# Patient Record
Sex: Female | Born: 1950
Health system: Southern US, Community
[De-identification: ages and names within clinical notes are randomized; demographics above are authoritative.]

## PROBLEM LIST (undated history)

## (undated) DIAGNOSIS — G43909 Migraine, unspecified, not intractable, without status migrainosus: Secondary | ICD-10-CM

## (undated) DIAGNOSIS — G4700 Insomnia, unspecified: Secondary | ICD-10-CM

## (undated) DIAGNOSIS — B009 Herpesviral infection, unspecified: Secondary | ICD-10-CM

## (undated) DIAGNOSIS — K219 Gastro-esophageal reflux disease without esophagitis: Secondary | ICD-10-CM

## (undated) DIAGNOSIS — E785 Hyperlipidemia, unspecified: Secondary | ICD-10-CM

## (undated) DIAGNOSIS — F32A Depression, unspecified: Secondary | ICD-10-CM

## (undated) HISTORY — DX: Insomnia, unspecified: G47.00

## (undated) HISTORY — PX: UPPER GI ENDOSCOPY: SHX6162

## (undated) HISTORY — PX: EYE SURGERY: SHX253

## (undated) HISTORY — PX: SHOULDER SURGERY: SHX246

## (undated) HISTORY — PX: COLONOSCOPY: SHX174

## (undated) HISTORY — PX: BREAST SURGERY: SHX581

## (undated) HISTORY — DX: Migraine, unspecified, not intractable, without status migrainosus: G43.909

## (undated) HISTORY — PX: VAGINAL HYSTERECTOMY: SUR661

## (undated) HISTORY — DX: Depression, unspecified: F32.A

## (undated) HISTORY — DX: Gastro-esophageal reflux disease without esophagitis: K21.9

## (undated) HISTORY — PX: WISDOM TOOTH EXTRACTION: SHX21

---

## 1999-10-29 ENCOUNTER — Other Ambulatory Visit: Admission: RE | Admit: 1999-10-29 | Discharge: 1999-10-29 | Payer: Self-pay | Admitting: *Deleted

## 2000-10-31 ENCOUNTER — Other Ambulatory Visit: Admission: RE | Admit: 2000-10-31 | Discharge: 2000-10-31 | Payer: Self-pay | Admitting: *Deleted

## 2001-10-26 ENCOUNTER — Other Ambulatory Visit: Admission: RE | Admit: 2001-10-26 | Discharge: 2001-10-26 | Payer: Self-pay | Admitting: Obstetrics and Gynecology

## 2002-04-07 ENCOUNTER — Ambulatory Visit (HOSPITAL_COMMUNITY): Admission: RE | Admit: 2002-04-07 | Discharge: 2002-04-07 | Payer: Self-pay | Admitting: Gastroenterology

## 2002-10-26 ENCOUNTER — Other Ambulatory Visit: Admission: RE | Admit: 2002-10-26 | Discharge: 2002-10-26 | Payer: Self-pay | Admitting: Obstetrics and Gynecology

## 2010-08-23 ENCOUNTER — Ambulatory Visit (HOSPITAL_COMMUNITY)
Admission: RE | Admit: 2010-08-23 | Discharge: 2010-08-23 | Payer: Self-pay | Source: Home / Self Care | Attending: Chiropractic Medicine | Admitting: Chiropractic Medicine

## 2010-08-31 ENCOUNTER — Ambulatory Visit (HOSPITAL_COMMUNITY)
Admission: RE | Admit: 2010-08-31 | Discharge: 2010-08-31 | Payer: Self-pay | Source: Home / Self Care | Attending: Chiropractic Medicine | Admitting: Chiropractic Medicine

## 2010-12-17 ENCOUNTER — Encounter (HOSPITAL_BASED_OUTPATIENT_CLINIC_OR_DEPARTMENT_OTHER)
Admission: RE | Admit: 2010-12-17 | Discharge: 2010-12-17 | Disposition: A | Discharge status: Home / Self Care | Payer: BC Managed Care – PPO | Source: Ambulatory Visit | Attending: Orthopaedic Surgery | Admitting: Orthopaedic Surgery

## 2010-12-18 ENCOUNTER — Ambulatory Visit (HOSPITAL_BASED_OUTPATIENT_CLINIC_OR_DEPARTMENT_OTHER)
Admission: RE | Admit: 2010-12-18 | Discharge: 2010-12-18 | Disposition: A | Discharge status: Home / Self Care | Payer: BC Managed Care – PPO | Source: Ambulatory Visit | Attending: Orthopaedic Surgery | Admitting: Orthopaedic Surgery

## 2010-12-18 DIAGNOSIS — Z01812 Encounter for preprocedural laboratory examination: Secondary | ICD-10-CM | POA: Insufficient documentation

## 2010-12-18 DIAGNOSIS — K219 Gastro-esophageal reflux disease without esophagitis: Secondary | ICD-10-CM | POA: Insufficient documentation

## 2010-12-18 DIAGNOSIS — M25819 Other specified joint disorders, unspecified shoulder: Secondary | ICD-10-CM | POA: Insufficient documentation

## 2010-12-18 DIAGNOSIS — M75120 Complete rotator cuff tear or rupture of unspecified shoulder, not specified as traumatic: Secondary | ICD-10-CM | POA: Insufficient documentation

## 2011-01-08 NOTE — Op Note (Signed)
NAMEDUNIA, PRINGLE NO.:  [id number]  MEDICAL RECORD NO.:  [id number]           PATIENT TYPE:  LOCATION:                                 FACILITY:  PHYSICIAN:  Lubertha Basque. Michele Suarez, M.D.DATE OF BIRTH:  1950/09/19  DATE OF PROCEDURE:  12/18/2010 DATE OF DISCHARGE:                              OPERATIVE REPORT   PREOPERATIVE DIAGNOSES: 1. Right shoulder impingement. 2. Right shoulder partial rotator cuff tear.  POSTOPERATIVE DIAGNOSES: 1. Right shoulder impingement. 2. Right shoulder full-thickness rotator cuff tear.  PROCEDURES: 1. Right shoulder arthroscopic acromioplasty. 2. Right shoulder arthroscopic partial claviculectomy. 3. Right shoulder arthroscopic rotator cuff repair.  ANESTHESIA:  General and block.  ATTENDING SURGEON:  Lubertha Basque. Jerl Santos, MD  ASSISTANT:  Ferdinand Lango, RN   INDICATIONS FOR PROCEDURE:  The patient is a 60 year old woman with many months of intense right shoulder pain.  This has persisted despite injection which helped for a few hours.  She has also tried therapy program and oral anti-inflammatories.  On MRI scan, she has a fairly significant partial-thickness cuff tear.  She has pain which limits her ability to use her arm and rest and she is offered an arthroscopy. Informed operative consent was obtained after discussion of possible complications including reaction to anesthesia and infection.  SUMMARY, FINDINGS, AND PROCEDURE:  Under general anesthesia and a block, a right shoulder arthroscopy was performed.  The glenohumeral joint showed no degenerative changes and the biceps tendon appeared benign. She did have some fairly deep tearing of the rotator cuff seen from below.  In the subacromial space, this was confirmed to be full- thickness at the supraspinatus attachment and about a centimeter of the tissue was detached completely.  The tissue seemed to be very good.  We performed an acromioplasty and a partial  claviculectomy and then repaired the rotator cuff with 2 reverse mattress sutures of FiberWire, stabilized with PushLock anchors.  DESCRIPTION OF PROCEDURE:  The patient was taken to the operating suite where general anesthetic was applied without difficulty.  She was also given a block in the preanesthesia area.  She was positioned in a beach- chair position and prepped and draped in a normal sterile fashion. After administration of IV Kefzol, an arthroscopy of the right shoulder was performed through a total of 4 portals.  Findings were as noted above and procedure consisted of the arthroscopic acromioplasty done with a bur in the lateral position following transfer of the bur to the posterior position.  We then performed a partial claviculectomy in a similar fashion.  I then created a bleeding bed of bone under the area of her rotator cuff tear.  We passed two #2 FiberWire in a reverse mattress suture fashion using a scorpion device through the rotator cuff.  We then utilized the sutures to reapproximate the cuff to the bleeding bed of bone using 2 PushLock anchors by Arthrex.  This seemed to give [place] a nice tight repair.  The shoulder was thoroughly irrigated followed by reapproximation of portals loosely with nylon.  Adaptic was applied followed by dry gauze and tape.  Estimated blood loss and intraoperative  can be fluids obtained from anesthesia records.  DISPOSITION:  The patient was extubated in the operating room and taken to the recovery room in stable addition.  She was to go home on the same day and follow up in my office closely.  I will contact her by phone tonight.     Lubertha Basque Jerl Santos, M.D.     PGD/MEDQ  D:  12/18/2010  T:  12/19/2010  Job:  [id number]  Electronically Signed by Michele Suarez M.D. on 01/08/2011 02:19:07 PM

## 2016-07-15 ENCOUNTER — Encounter (HOSPITAL_COMMUNITY): Payer: Self-pay

## 2016-07-15 ENCOUNTER — Other Ambulatory Visit: Payer: Self-pay | Admitting: Obstetrics and Gynecology

## 2016-07-15 ENCOUNTER — Encounter (HOSPITAL_COMMUNITY)
Admission: RE | Admit: 2016-07-15 | Discharge: 2016-07-15 | Disposition: A | Discharge status: Home / Self Care | Payer: BLUE CROSS/BLUE SHIELD | Source: Ambulatory Visit | Attending: Obstetrics and Gynecology | Admitting: Obstetrics and Gynecology

## 2016-07-15 ENCOUNTER — Other Ambulatory Visit: Payer: Self-pay

## 2016-07-15 DIAGNOSIS — E785 Hyperlipidemia, unspecified: Secondary | ICD-10-CM | POA: Diagnosis not present

## 2016-07-15 DIAGNOSIS — Z01818 Encounter for other preprocedural examination: Secondary | ICD-10-CM | POA: Diagnosis not present

## 2016-07-15 DIAGNOSIS — N901 Moderate vulvar dysplasia: Secondary | ICD-10-CM | POA: Diagnosis present

## 2016-07-15 DIAGNOSIS — N959 Unspecified menopausal and perimenopausal disorder: Secondary | ICD-10-CM | POA: Diagnosis not present

## 2016-07-15 HISTORY — DX: Hyperlipidemia, unspecified: E78.5

## 2016-07-15 HISTORY — DX: Herpesviral infection, unspecified: B00.9

## 2016-07-15 NOTE — Patient Instructions (Addendum)
Your procedure is scheduled on: Wednesday, 11/8  Enter through the Main Entrance of [facility] at: 10 AM  Pick up the phone at the desk and dial [phone].  Call this number if you have problems the morning of surgery: [phone].  Remember: Do NOT eat or drink (including water) after midnight Tuesday 11/7  Take these medicines the morning of surgery with a SIP OF WATER:  None  Do NOT wear jewelry (body piercing), metal hair clips/bobby pins, make-up, or nail polish.  Do NOT wear lotions, powders, or perfumes.  You may wear deoderant.  Do NOT shave for 48 hours prior to surgery.  Do NOT bring valuables to the hospital.   Have a responsible adult drive you home and stay with you for 24 hours after your procedure.  Home with friend Kyra Manges cell [phone]

## 2016-07-16 LAB — CBC
HCT: 41.9 % (ref 36.0–46.0)
Hemoglobin: 14.5 g/dL (ref 12.0–15.0)
MCH: 32.5 pg (ref 26.0–34.0)
MCHC: 34.6 g/dL (ref 30.0–36.0)
MCV: 93.9 fL (ref 78.0–100.0)
PLATELETS: 241 10*3/uL (ref 150–400)
RBC: 4.46 MIL/uL (ref 3.87–5.11)
RDW: 13.6 % (ref 11.5–15.5)
WBC: 7.5 10*3/uL (ref 4.0–10.5)

## 2016-07-16 MED ORDER — CEFAZOLIN SODIUM-DEXTROSE 2-4 GM/100ML-% IV SOLN
2.0000 g | INTRAVENOUS | Status: AC
  Administered 2016-07-17: 2 g via INTRAVENOUS

## 2016-07-16 NOTE — H&P (Signed)
Michele Suarez is an 65 y.o. female for wide local excision for VIN 2.  Pertinent Gynecological History: Menses: post-menopausal Bleeding: na Contraception: none DES exposure: denies Blood transfusions: none Sexually transmitted diseases: no past history Previous GYN Procedures: DNC  Last mammogram: normal Date: 2017 Last pap: normal Date: 2017 OB History: G2, P2   Menstrual History: Menarche age: 64 No LMP recorded. Patient is postmenopausal.    Past Medical History:  Diagnosis Date  . HSV infection   . Hyperlipidemia   . SVD (spontaneous vaginal delivery)    x 3    Past Surgical History:  Procedure Laterality Date  . BREAST SURGERY     reduction  . COLONOSCOPY    . EYE SURGERY Bilateral    cataract  . SHOULDER SURGERY Bilateral    x 2  . UPPER GI ENDOSCOPY    . VAGINAL HYSTERECTOMY    . WISDOM TOOTH EXTRACTION      No family history on file.  Social History:  reports that she has never smoked. She has never used smokeless tobacco. She reports that she drinks about 1.8 - 2.4 oz of alcohol per week . She reports that she does not use drugs.  Allergies: No Known Allergies  No prescriptions prior to admission.    Review of Systems  Constitutional: Negative.   All other systems reviewed and are negative.   There were no vitals taken for this visit. Physical Exam  Nursing note and vitals reviewed. Constitutional: She is oriented to person, place, and time. She appears well-developed and well-nourished.  Eyes: Pupils are equal, round, and reactive to light.  Neck: Normal range of motion. Neck supple.  Cardiovascular: Normal rate and regular rhythm.   Respiratory: Effort normal and breath sounds normal.  GI: Soft. Bowel sounds are normal.  Genitourinary: Vagina normal and uterus normal.  Musculoskeletal: Normal range of motion.  Neurological: She is alert and oriented to person, place, and time. She has normal reflexes.  Skin: Skin is warm and dry.   Psychiatric: She has a normal mood and affect.    No results found for this or any previous visit (from the past 24 hour(s)).  No results found.  Assessment/Plan: VIN 2 by bx Wide local excision Consent done.  Irish Piech J 07/16/2016, 8:10 PM

## 2016-07-17 ENCOUNTER — Ambulatory Visit (HOSPITAL_COMMUNITY): Payer: BLUE CROSS/BLUE SHIELD | Admitting: Anesthesiology

## 2016-07-17 ENCOUNTER — Encounter (HOSPITAL_COMMUNITY): Payer: Self-pay | Admitting: Anesthesiology

## 2016-07-17 ENCOUNTER — Encounter (HOSPITAL_COMMUNITY)
Admission: RE | Disposition: A | Discharge status: Home / Self Care | Payer: Self-pay | Source: Ambulatory Visit | Attending: Obstetrics and Gynecology

## 2016-07-17 ENCOUNTER — Ambulatory Visit (HOSPITAL_COMMUNITY)
Admission: RE | Admit: 2016-07-17 | Discharge: 2016-07-17 | Disposition: A | Discharge status: Home / Self Care | Payer: BLUE CROSS/BLUE SHIELD | Source: Ambulatory Visit | Attending: Obstetrics and Gynecology | Admitting: Obstetrics and Gynecology

## 2016-07-17 DIAGNOSIS — Z01818 Encounter for other preprocedural examination: Secondary | ICD-10-CM | POA: Insufficient documentation

## 2016-07-17 DIAGNOSIS — N901 Moderate vulvar dysplasia: Secondary | ICD-10-CM | POA: Insufficient documentation

## 2016-07-17 DIAGNOSIS — E785 Hyperlipidemia, unspecified: Secondary | ICD-10-CM | POA: Insufficient documentation

## 2016-07-17 DIAGNOSIS — N959 Unspecified menopausal and perimenopausal disorder: Secondary | ICD-10-CM | POA: Insufficient documentation

## 2016-07-17 HISTORY — PX: VULVECTOMY: SHX1086

## 2016-07-17 SURGERY — WIDE EXCISION VULVECTOMY
Anesthesia: General

## 2016-07-17 MED ORDER — FENTANYL CITRATE (PF) 100 MCG/2ML IJ SOLN
INTRAMUSCULAR | Status: AC
  Filled 2016-07-17: qty 4

## 2016-07-17 MED ORDER — PROPOFOL 10 MG/ML IV BOLUS
INTRAVENOUS | Status: DC | PRN
  Administered 2016-07-17: 150 mg via INTRAVENOUS

## 2016-07-17 MED ORDER — HYDROMORPHONE HCL 1 MG/ML IJ SOLN: 0.2500 mg | INTRAMUSCULAR | Status: DC | PRN

## 2016-07-17 MED ORDER — MIDAZOLAM HCL 2 MG/2ML IJ SOLN
INTRAMUSCULAR | Status: DC | PRN
  Administered 2016-07-17: 2 mg via INTRAVENOUS

## 2016-07-17 MED ORDER — FENTANYL CITRATE (PF) 100 MCG/2ML IJ SOLN
INTRAMUSCULAR | Status: DC | PRN
  Administered 2016-07-17 (×2): 50 ug via INTRAVENOUS

## 2016-07-17 MED ORDER — BUPIVACAINE HCL (PF) 0.25 % IJ SOLN
INTRAMUSCULAR | Status: DC | PRN
  Administered 2016-07-17: 10 mL

## 2016-07-17 MED ORDER — OXYCODONE-ACETAMINOPHEN 5-325 MG PO TABS: 1.0000 | ORAL_TABLET | ORAL | 0 refills | Status: DC | PRN

## 2016-07-17 MED ORDER — DEXAMETHASONE SODIUM PHOSPHATE 4 MG/ML IJ SOLN
INTRAMUSCULAR | Status: DC | PRN
  Administered 2016-07-17: 4 mg via INTRAVENOUS

## 2016-07-17 MED ORDER — LACTATED RINGERS IV SOLN
INTRAVENOUS | Status: DC
  Administered 2016-07-17 (×3): via INTRAVENOUS

## 2016-07-17 MED ORDER — PHENYLEPHRINE HCL 10 MG/ML IJ SOLN
INTRAMUSCULAR | Status: DC | PRN
Start: 2016-07-17 — End: 2016-07-17
  Administered 2016-07-17 (×3): 40 ug via INTRAVENOUS
  Administered 2016-07-17: 80 ug via INTRAVENOUS

## 2016-07-17 MED ORDER — KETOROLAC TROMETHAMINE 30 MG/ML IJ SOLN
INTRAMUSCULAR | Status: AC
  Filled 2016-07-17: qty 1

## 2016-07-17 MED ORDER — MIDAZOLAM HCL 2 MG/2ML IJ SOLN
INTRAMUSCULAR | Status: AC
  Filled 2016-07-17: qty 2

## 2016-07-17 MED ORDER — PROPOFOL 10 MG/ML IV BOLUS
INTRAVENOUS | Status: AC
  Filled 2016-07-17: qty 20

## 2016-07-17 MED ORDER — LIDOCAINE HCL (CARDIAC) 20 MG/ML IV SOLN
INTRAVENOUS | Status: AC
  Filled 2016-07-17: qty 5

## 2016-07-17 MED ORDER — MEPERIDINE HCL 25 MG/ML IJ SOLN: 6.2500 mg | INTRAMUSCULAR | Status: DC | PRN

## 2016-07-17 MED ORDER — ONDANSETRON HCL 4 MG/2ML IJ SOLN
INTRAMUSCULAR | Status: AC
  Filled 2016-07-17: qty 2

## 2016-07-17 MED ORDER — ONDANSETRON HCL 4 MG/2ML IJ SOLN: 4.0000 mg | Freq: Once | INTRAMUSCULAR | Status: DC | PRN

## 2016-07-17 MED ORDER — DEXAMETHASONE SODIUM PHOSPHATE 4 MG/ML IJ SOLN
INTRAMUSCULAR | Status: AC
  Filled 2016-07-17: qty 1

## 2016-07-17 MED ORDER — BUPIVACAINE HCL (PF) 0.25 % IJ SOLN
INTRAMUSCULAR | Status: AC
  Filled 2016-07-17: qty 30

## 2016-07-17 MED ORDER — ONDANSETRON HCL 4 MG/2ML IJ SOLN
INTRAMUSCULAR | Status: DC | PRN
  Administered 2016-07-17: 4 mg via INTRAVENOUS

## 2016-07-17 MED ORDER — LIDOCAINE HCL (CARDIAC) 20 MG/ML IV SOLN
INTRAVENOUS | Status: DC | PRN
  Administered 2016-07-17: 60 mg via INTRAVENOUS

## 2016-07-17 MED ORDER — KETOROLAC TROMETHAMINE 30 MG/ML IJ SOLN
INTRAMUSCULAR | Status: DC | PRN
  Administered 2016-07-17: 30 mg via INTRAVENOUS

## 2016-07-17 SURGICAL SUPPLY — 21 items
BLADE SURG 15 STRL LF C SS BP (BLADE) ×1 IMPLANT
BLADE SURG 15 STRL SS (BLADE) ×3
CATH ROBINSON RED A/P 16FR (CATHETERS) ×3 IMPLANT
CLOTH BEACON ORANGE TIMEOUT ST (SAFETY) ×3 IMPLANT
COUNTER NEEDLE 1200 MAGNETIC (NEEDLE) ×2 IMPLANT
ELECT NDL TIP 2.8 STRL (NEEDLE) IMPLANT
ELECT NEEDLE TIP 2.8 STRL (NEEDLE) ×3 IMPLANT
ELECT REM PT RETURN 9FT ADLT (ELECTROSURGICAL) ×3
ELECTRODE REM PT RTRN 9FT ADLT (ELECTROSURGICAL) IMPLANT
GLOVE BIO SURGEON STRL SZ7.5 (GLOVE) ×3 IMPLANT
GLOVE BIOGEL PI IND STRL 7.0 (GLOVE) ×1 IMPLANT
GLOVE BIOGEL PI INDICATOR 7.0 (GLOVE) ×2
GOWN STRL REUS W/TWL LRG LVL3 (GOWN DISPOSABLE) ×9 IMPLANT
NEEDLE HYPO 22GX1.5 SAFETY (NEEDLE) ×3 IMPLANT
PACK VAGINAL MINOR WOMEN LF (CUSTOM PROCEDURE TRAY) ×3 IMPLANT
PAD OB MATERNITY 4.3X12.25 (PERSONAL CARE ITEMS) ×3 IMPLANT
PENCIL BUTTON HOLSTER BLD 10FT (ELECTRODE) ×2 IMPLANT
SUT VICRYL RAPIDE 3-0 36IN (SUTURE) ×6 IMPLANT
SUT VICRYL RAPIDE 4/0 PS 2 (SUTURE) IMPLANT
TOWEL OR 17X24 6PK STRL BLUE (TOWEL DISPOSABLE) ×6 IMPLANT
WATER STERILE IRR 1000ML POUR (IV SOLUTION) ×3 IMPLANT

## 2016-07-17 NOTE — Anesthesia Procedure Notes (Signed)
Procedure Name: LMA Insertion Date/Time: 07/17/2016 11:23 AM Performed by: Brock Ra Pre-anesthesia Checklist: Patient identified, Patient being monitored, Emergency Drugs available, Timeout performed and Suction available Patient Re-evaluated:Patient Re-evaluated prior to inductionOxygen Delivery Method: Circle System Utilized Preoxygenation: Pre-oxygenation with 100% oxygen Intubation Type: IV induction Ventilation: Mask ventilation without difficulty LMA: LMA inserted LMA Size: 4.0 Number of attempts: 1 Placement Confirmation: positive ETCO2 and breath sounds checked- equal and bilateral Dental Injury: Teeth and Oropharynx as per pre-operative assessment

## 2016-07-17 NOTE — Discharge Instructions (Signed)

## 2016-07-17 NOTE — Op Note (Signed)
07/17/2016  12:04 PM  PATIENT:  Michele Suarez  65 y.o. female  PRE-OPERATIVE DIAGNOSIS:  VIN 2  POST-OPERATIVE DIAGNOSIS:  VIN 2  PROCEDURE:  Procedure(s): WIDE Local Vulvar EXCISION  SURGEON:  Surgeon(s): Brien Few, MD  ASSISTANTS: none   ANESTHESIA:   local and general  ESTIMATED BLOOD LOSS:minimal  DRAINS: none   LOCAL MEDICATIONS USED:  MARCAINE    and Amount: 10 ml  SPECIMEN:  Source of Specimen:  posterior fourchette  DISPOSITION OF SPECIMEN:  PATHOLOGY  COUNTS:  YES  DICTATION #: no dictation number given  PLAN OF CARE: dc home  PATIENT DISPOSITION:  PACU - hemodynamically stable.

## 2016-07-17 NOTE — Progress Notes (Signed)
Patient seen and examined. Consent witnessed and signed. No changes noted. Update completed.Patient ID: Michele Suarez, female   DOB: [birth year], 65 y.o.   MRN: [id number]

## 2016-07-17 NOTE — Anesthesia Preprocedure Evaluation (Signed)

## 2016-07-17 NOTE — Anesthesia Postprocedure Evaluation (Signed)
Anesthesia Post Note  Patient: Desirea Loosli  Procedure(s) Performed: Procedure(s) (LRB): WIDE Local Vulvar EXCISION (N/A)  Patient location during evaluation: PACU Anesthesia Type: General Level of consciousness: awake and alert Pain management: pain level controlled Vital Signs Assessment: post-procedure vital signs reviewed and stable Respiratory status: spontaneous breathing, nonlabored ventilation, respiratory function stable and patient connected to nasal cannula oxygen Cardiovascular status: blood pressure returned to baseline and stable Postop Assessment: no signs of nausea or vomiting Anesthetic complications: no     Last Vitals:  Vitals:   07/17/16 1245 07/17/16 1258  BP: 102/64   Pulse: 61 75  Resp: 14 (!) 21  Temp:      Last Pain:  Vitals:   07/17/16 1036  TempSrc: Oral   Pain Goal: Patients Stated Pain Goal: 3 (07/17/16 1036)               Tarhonda Hollenberg DAVID

## 2016-07-17 NOTE — Transfer of Care (Signed)
Immediate Anesthesia Transfer of Care Note  Patient: Michele Suarez  Procedure(s) Performed: Procedure(s): WIDE Local Vulvar EXCISION (N/A)  Patient Location: PACU  Anesthesia Type:General  Level of Consciousness: awake, alert , oriented and patient cooperative  Airway & Oxygen Therapy: Patient Spontanous Breathing and Patient connected to nasal cannula oxygen  Post-op Assessment: Report given to RN and Post -op Vital signs reviewed and stable  Post vital signs: Reviewed and stable  Last Vitals:  TEMP 97.8 BP 124/67 HR 71 POX 100  Last Pain: 3     Patients Stated Pain Goal: 3 ([id number])  Complications:

## 2016-07-18 ENCOUNTER — Encounter (HOSPITAL_COMMUNITY): Payer: Self-pay | Admitting: Obstetrics and Gynecology

## 2016-07-18 NOTE — Op Note (Signed)
NAMEJAVEN, Michele Suarez NO.:  [id number]  MEDICAL RECORD NO.:  [id number]  LOCATION:  [facility]                          FACILITY:  [facility]  PHYSICIAN:  Lovenia Kim, M.D.DATE OF BIRTH:  October 15, 1950  DATE OF PROCEDURE: DATE OF DISCHARGE:  07/17/2016                              OPERATIVE REPORT   PREOPERATIVE DIAGNOSIS:  Vulval intraepithelial neoplasia 2.  POSTOPERATIVE DIAGNOSIS:  Vulval intraepithelial neoplasia 2.  PROCEDURE:  Wide local vulvar incision.  SURGEON:  Lovenia Kim, M.D.  ASSISTANT:  None.  ANESTHESIA:  Local general.  ESTIMATED BLOOD LOSS:  Minimal.  DRAINS:  None.  COUNTS:  Correct.  DISPOSITION:  The patient to recovery in good condition.  DESCRIPTION OF PROCEDURE:  After being apprised of the risks of anesthesia, infection, bleeding, injury to surrounding organs, possible need for repair, delayed versus immediate complications to include bowel and bladder injury, possible need for repair, inability to cure cancer. The patient was brought to the operating room.  Consent signed.  She was administered general anesthetic without complications.  Prepped and draped in usual sterile fashion.  Feet were placed in [place]. Exam under anesthesia reveals normal size uterus, small anterior cystocele defect, moderate rectocele and well-healed vulvar lesion in the right posterior fourchette.  This was marked creating a 1 cm margins in 4 directions.  An elliptical incision was made encompassing these margins following along the lines of the introitus to remove the vulvar defect.  Sharp dissection and electrocautery were used to excise the lesion.  Hemostasis was achieved.  Deep sutures were placed bringing after the vagina was undermined creating less tension upon the suture closure.  Deep defects were closed using a 3-0 Vicryl repeat interrupted suture and the skin was reapproximated using a 3-0 Vicryl repeat interrupted fashion.   The patient tolerated the procedure well.  She was awakened, and transferred to recovery in good condition.  Specimen was oriented and sent to Pathology for final examination and confirmation.     Lovenia Kim, M.D.     RJT/MEDQ  D:  07/17/2016  T:  07/18/2016  Job:  [id number]

## 2016-07-18 NOTE — Op Note (Signed)
Michele Suarez, IMLAY NO.:  [id number]  MEDICAL RECORD NO.:  [id number]  LOCATION:  [facility]                           FACILITY:  [facility]  PHYSICIAN:  Lovenia Kim, M.D.DATE OF BIRTH:  [birth year]  DATE OF PROCEDURE: DATE OF DISCHARGE:  07/15/2016                              OPERATIVE REPORT   PREOPERATIVE DIAGNOSIS:  VIN2 for wide local excision.  POSTOPERATIVE DIAGNOSIS:  VIN2 for wide local excision.  PROCEDURE:  Wide local vulvar incision.  SURGEON:  Lovenia Kim, M.D.  ASSISTANT:  None.  ANESTHESIA:  General local.  ESTIMATED BLOOD LOSS:  Less than 50 mL.  COMPLICATIONS:  None.  DRAINS:  None.  COUNTS:  Correct.  The patient to recovery in good condition.  SPECIMEN:  Vulvar lesion oriented on corkboard.  DESCRIPTION OF PROCEDURE:  After being apprised of the risks of anesthesia, infection, bleeding, injury to surrounding organs, possible need for repair, inability to prevent future cancer, consents were signed.  The patient was brought to the operating room, where she was placed in dorsal lithotomy position.  Feet were placed in [place].  Exam under anesthesia revealed rectocele, small cystocele, no evidence of uterine prolapse, and a well healing acute punch biopsy site on the right posterior fourchette.  This area was then marked using a marker, measuring 1 cm on 4 sides creating a 1 cm margin from the previous excision of the lesion.  An elliptical incision was then made encompassing the area of the previously noted lesion using electrocautery and sharp dissection.  At this time, specimen was excised, oriented, and sent to Pathology for confirmation and examination.  The vagina was then undermined anteriorly freeing up the tissues, so the closure will be not under undue tension.  Deep sutures were placed using a 3-0 Vicryl Rapide reapproximated the tissue.  Hemostasis was achieved.  The vulvar tissue was then closed using  interrupted 3-0 Vicryl Rapide stitches.  Good hemostasis was noted.  The area was infiltrated using a dilute 0.25% Marcaine solution.  The patient was awakened and transferred to recovery in good condition.     Lovenia Kim, M.D.     RJT/MEDQ  D:  07/17/2016  T:  07/18/2016  Job:  [id number]

## 2018-02-11 DIAGNOSIS — M8589 Other specified disorders of bone density and structure, multiple sites: Secondary | ICD-10-CM | POA: Diagnosis not present

## 2018-02-18 DIAGNOSIS — H11002 Unspecified pterygium of left eye: Secondary | ICD-10-CM | POA: Diagnosis not present

## 2018-02-18 DIAGNOSIS — H11001 Unspecified pterygium of right eye: Secondary | ICD-10-CM | POA: Diagnosis not present

## 2018-02-18 DIAGNOSIS — H0015 Chalazion left lower eyelid: Secondary | ICD-10-CM | POA: Diagnosis not present

## 2018-02-18 DIAGNOSIS — H18451 Nodular corneal degeneration, right eye: Secondary | ICD-10-CM | POA: Diagnosis not present

## 2018-02-19 DIAGNOSIS — H903 Sensorineural hearing loss, bilateral: Secondary | ICD-10-CM | POA: Diagnosis not present

## 2018-02-19 DIAGNOSIS — H9313 Tinnitus, bilateral: Secondary | ICD-10-CM | POA: Diagnosis not present

## 2018-03-02 DIAGNOSIS — Z779 Other contact with and (suspected) exposures hazardous to health: Secondary | ICD-10-CM | POA: Diagnosis not present

## 2018-03-02 DIAGNOSIS — Z01419 Encounter for gynecological examination (general) (routine) without abnormal findings: Secondary | ICD-10-CM | POA: Diagnosis not present

## 2018-03-25 DIAGNOSIS — H11002 Unspecified pterygium of left eye: Secondary | ICD-10-CM | POA: Diagnosis not present

## 2018-03-25 DIAGNOSIS — H43813 Vitreous degeneration, bilateral: Secondary | ICD-10-CM | POA: Diagnosis not present

## 2018-03-25 DIAGNOSIS — H11001 Unspecified pterygium of right eye: Secondary | ICD-10-CM | POA: Diagnosis not present

## 2018-03-25 DIAGNOSIS — H43393 Other vitreous opacities, bilateral: Secondary | ICD-10-CM | POA: Diagnosis not present

## 2018-03-30 DIAGNOSIS — N901 Moderate vulvar dysplasia: Secondary | ICD-10-CM | POA: Diagnosis not present

## 2018-05-04 DIAGNOSIS — Z961 Presence of intraocular lens: Secondary | ICD-10-CM | POA: Diagnosis not present

## 2018-05-04 DIAGNOSIS — H16223 Keratoconjunctivitis sicca, not specified as Sjogren's, bilateral: Secondary | ICD-10-CM | POA: Diagnosis not present

## 2018-05-04 DIAGNOSIS — H11002 Unspecified pterygium of left eye: Secondary | ICD-10-CM | POA: Diagnosis not present

## 2018-05-04 DIAGNOSIS — H18453 Nodular corneal degeneration, bilateral: Secondary | ICD-10-CM | POA: Diagnosis not present

## 2018-05-25 DIAGNOSIS — R3129 Other microscopic hematuria: Secondary | ICD-10-CM | POA: Diagnosis not present

## 2018-05-25 DIAGNOSIS — N390 Urinary tract infection, site not specified: Secondary | ICD-10-CM | POA: Diagnosis not present

## 2018-06-09 DIAGNOSIS — H9313 Tinnitus, bilateral: Secondary | ICD-10-CM | POA: Diagnosis not present

## 2018-06-09 DIAGNOSIS — H9041 Sensorineural hearing loss, unilateral, right ear, with unrestricted hearing on the contralateral side: Secondary | ICD-10-CM | POA: Diagnosis not present

## 2018-06-12 DIAGNOSIS — R1310 Dysphagia, unspecified: Secondary | ICD-10-CM | POA: Diagnosis not present

## 2018-06-12 DIAGNOSIS — K219 Gastro-esophageal reflux disease without esophagitis: Secondary | ICD-10-CM | POA: Diagnosis not present

## 2018-06-26 ENCOUNTER — Other Ambulatory Visit: Payer: Self-pay | Admitting: Otolaryngology

## 2018-06-26 DIAGNOSIS — IMO0001 Reserved for inherently not codable concepts without codable children: Secondary | ICD-10-CM

## 2018-06-26 DIAGNOSIS — H9041 Sensorineural hearing loss, unilateral, right ear, with unrestricted hearing on the contralateral side: Secondary | ICD-10-CM

## 2018-07-09 ENCOUNTER — Ambulatory Visit
Admission: RE | Admit: 2018-07-09 | Discharge: 2018-07-09 | Disposition: A | Discharge status: Home / Self Care | Payer: Medicare Other | Source: Ambulatory Visit | Attending: Otolaryngology | Admitting: Otolaryngology

## 2018-07-09 DIAGNOSIS — H9041 Sensorineural hearing loss, unilateral, right ear, with unrestricted hearing on the contralateral side: Secondary | ICD-10-CM

## 2018-07-09 DIAGNOSIS — IMO0001 Reserved for inherently not codable concepts without codable children: Secondary | ICD-10-CM

## 2018-07-09 MED ORDER — GADOBENATE DIMEGLUMINE 529 MG/ML IV SOLN
12.0000 mL | Freq: Once | INTRAVENOUS | Status: AC | PRN
  Administered 2018-07-09: 12 mL via INTRAVENOUS

## 2018-07-23 DIAGNOSIS — K209 Esophagitis, unspecified: Secondary | ICD-10-CM | POA: Diagnosis not present

## 2018-07-23 DIAGNOSIS — K208 Other esophagitis: Secondary | ICD-10-CM | POA: Diagnosis not present

## 2018-07-23 DIAGNOSIS — K2960 Other gastritis without bleeding: Secondary | ICD-10-CM | POA: Diagnosis not present

## 2018-07-23 DIAGNOSIS — R1310 Dysphagia, unspecified: Secondary | ICD-10-CM | POA: Diagnosis not present

## 2018-07-23 DIAGNOSIS — K2980 Duodenitis without bleeding: Secondary | ICD-10-CM | POA: Diagnosis not present

## 2018-09-16 DIAGNOSIS — Z961 Presence of intraocular lens: Secondary | ICD-10-CM | POA: Diagnosis not present

## 2018-09-16 DIAGNOSIS — H18453 Nodular corneal degeneration, bilateral: Secondary | ICD-10-CM | POA: Diagnosis not present

## 2018-09-16 DIAGNOSIS — H16223 Keratoconjunctivitis sicca, not specified as Sjogren's, bilateral: Secondary | ICD-10-CM | POA: Diagnosis not present

## 2018-09-16 DIAGNOSIS — H11002 Unspecified pterygium of left eye: Secondary | ICD-10-CM | POA: Diagnosis not present

## 2018-10-13 DIAGNOSIS — Z85828 Personal history of other malignant neoplasm of skin: Secondary | ICD-10-CM | POA: Diagnosis not present

## 2018-10-13 DIAGNOSIS — L821 Other seborrheic keratosis: Secondary | ICD-10-CM | POA: Diagnosis not present

## 2018-10-13 DIAGNOSIS — Z411 Encounter for cosmetic surgery: Secondary | ICD-10-CM | POA: Diagnosis not present

## 2018-10-13 DIAGNOSIS — C44619 Basal cell carcinoma of skin of left upper limb, including shoulder: Secondary | ICD-10-CM | POA: Diagnosis not present

## 2018-10-13 DIAGNOSIS — D225 Melanocytic nevi of trunk: Secondary | ICD-10-CM | POA: Diagnosis not present

## 2018-10-13 DIAGNOSIS — L814 Other melanin hyperpigmentation: Secondary | ICD-10-CM | POA: Diagnosis not present

## 2018-10-13 DIAGNOSIS — D485 Neoplasm of uncertain behavior of skin: Secondary | ICD-10-CM | POA: Diagnosis not present

## 2018-10-13 DIAGNOSIS — Z23 Encounter for immunization: Secondary | ICD-10-CM | POA: Diagnosis not present

## 2018-11-02 DIAGNOSIS — C44619 Basal cell carcinoma of skin of left upper limb, including shoulder: Secondary | ICD-10-CM | POA: Diagnosis not present

## 2018-11-19 DIAGNOSIS — H43393 Other vitreous opacities, bilateral: Secondary | ICD-10-CM | POA: Diagnosis not present

## 2018-11-19 DIAGNOSIS — H43813 Vitreous degeneration, bilateral: Secondary | ICD-10-CM | POA: Diagnosis not present

## 2018-11-19 DIAGNOSIS — H35033 Hypertensive retinopathy, bilateral: Secondary | ICD-10-CM | POA: Diagnosis not present

## 2018-11-19 DIAGNOSIS — Z961 Presence of intraocular lens: Secondary | ICD-10-CM | POA: Diagnosis not present

## 2018-12-15 DIAGNOSIS — E7849 Other hyperlipidemia: Secondary | ICD-10-CM | POA: Diagnosis not present

## 2018-12-15 DIAGNOSIS — R8290 Unspecified abnormal findings in urine: Secondary | ICD-10-CM | POA: Diagnosis not present

## 2018-12-15 DIAGNOSIS — N390 Urinary tract infection, site not specified: Secondary | ICD-10-CM | POA: Diagnosis not present

## 2018-12-22 DIAGNOSIS — M8580 Other specified disorders of bone density and structure, unspecified site: Secondary | ICD-10-CM | POA: Diagnosis not present

## 2018-12-22 DIAGNOSIS — F5104 Psychophysiologic insomnia: Secondary | ICD-10-CM | POA: Diagnosis not present

## 2018-12-22 DIAGNOSIS — Z1339 Encounter for screening examination for other mental health and behavioral disorders: Secondary | ICD-10-CM | POA: Diagnosis not present

## 2018-12-22 DIAGNOSIS — R634 Abnormal weight loss: Secondary | ICD-10-CM | POA: Diagnosis not present

## 2018-12-22 DIAGNOSIS — M545 Low back pain: Secondary | ICD-10-CM | POA: Diagnosis not present

## 2018-12-22 DIAGNOSIS — Z0000 Encounter for general adult medical examination without abnormal findings: Secondary | ICD-10-CM | POA: Diagnosis not present

## 2018-12-22 DIAGNOSIS — K219 Gastro-esophageal reflux disease without esophagitis: Secondary | ICD-10-CM | POA: Diagnosis not present

## 2018-12-22 DIAGNOSIS — Z1331 Encounter for screening for depression: Secondary | ICD-10-CM | POA: Diagnosis not present

## 2018-12-22 DIAGNOSIS — F900 Attention-deficit hyperactivity disorder, predominantly inattentive type: Secondary | ICD-10-CM | POA: Diagnosis not present

## 2018-12-22 DIAGNOSIS — E785 Hyperlipidemia, unspecified: Secondary | ICD-10-CM | POA: Diagnosis not present

## 2019-05-10 DIAGNOSIS — Z23 Encounter for immunization: Secondary | ICD-10-CM | POA: Diagnosis not present

## 2019-06-02 DIAGNOSIS — Z1231 Encounter for screening mammogram for malignant neoplasm of breast: Secondary | ICD-10-CM | POA: Diagnosis not present

## 2019-06-02 DIAGNOSIS — Z01419 Encounter for gynecological examination (general) (routine) without abnormal findings: Secondary | ICD-10-CM | POA: Diagnosis not present

## 2019-06-02 DIAGNOSIS — Z779 Other contact with and (suspected) exposures hazardous to health: Secondary | ICD-10-CM | POA: Diagnosis not present

## 2019-06-02 DIAGNOSIS — A6000 Herpesviral infection of urogenital system, unspecified: Secondary | ICD-10-CM | POA: Diagnosis not present

## 2019-06-02 DIAGNOSIS — N901 Moderate vulvar dysplasia: Secondary | ICD-10-CM | POA: Diagnosis not present

## 2019-08-17 DIAGNOSIS — M67432 Ganglion, left wrist: Secondary | ICD-10-CM | POA: Diagnosis not present

## 2019-09-14 DIAGNOSIS — M67432 Ganglion, left wrist: Secondary | ICD-10-CM | POA: Diagnosis not present

## 2019-09-23 DIAGNOSIS — L814 Other melanin hyperpigmentation: Secondary | ICD-10-CM | POA: Diagnosis not present

## 2019-09-23 DIAGNOSIS — L821 Other seborrheic keratosis: Secondary | ICD-10-CM | POA: Diagnosis not present

## 2019-09-23 DIAGNOSIS — Z85828 Personal history of other malignant neoplasm of skin: Secondary | ICD-10-CM | POA: Diagnosis not present

## 2019-09-23 DIAGNOSIS — D225 Melanocytic nevi of trunk: Secondary | ICD-10-CM | POA: Diagnosis not present

## 2019-09-23 DIAGNOSIS — C44622 Squamous cell carcinoma of skin of right upper limb, including shoulder: Secondary | ICD-10-CM | POA: Diagnosis not present

## 2019-10-06 DIAGNOSIS — C44622 Squamous cell carcinoma of skin of right upper limb, including shoulder: Secondary | ICD-10-CM | POA: Diagnosis not present

## 2019-10-17 ENCOUNTER — Ambulatory Visit: Payer: Medicare Other

## 2019-11-01 IMAGING — MR MR HEAD WO/W CM
12 of 13 series · 40 of 48 positions shown · IV contrast (multihance)
Comparison: None.

CLINICAL DATA: Asymmetric right sensorineural hearing loss

Creatinine was obtained on site at [HOSPITAL] at [HOSPITAL].
Results: Creatinine 0.7 mg/dL.
EXAM:
MRI HEAD WITHOUT AND WITH CONTRAST
TECHNIQUE: Multiplanar, multiecho pulse sequences of the brain and surrounding
structures were obtained without and with intravenous contrast.
CONTRAST:  12mL MULTIHANCE GADOBENATE DIMEGLUMINE 529 MG/ML IV SOLN

[Series 2: T1 · sagittal · 5.0mm · 0.45mm/px · 2 of 25 slices shown (1 of 3)]
[im 1/25]
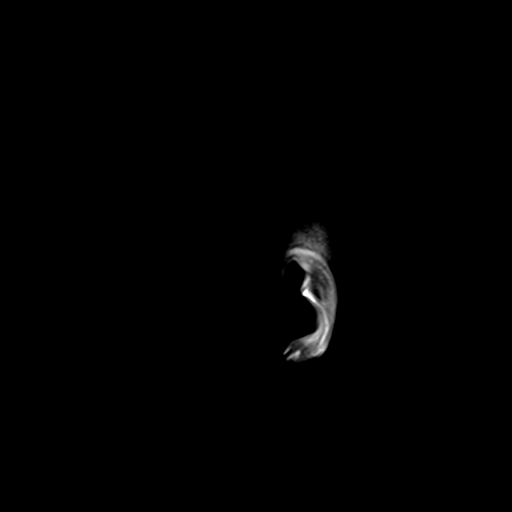
[im 25/25]
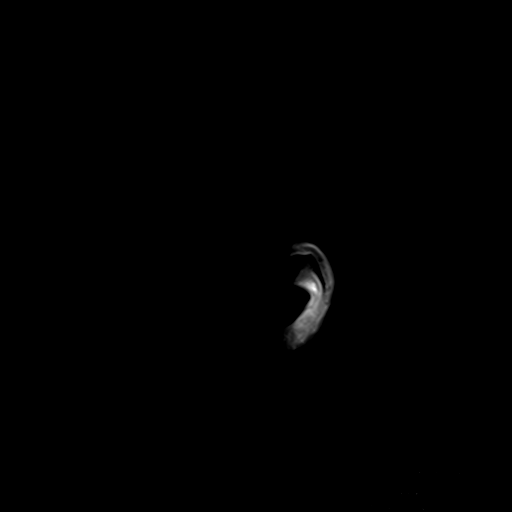

[Series 3: DWI · axial · 3.0mm · 1.80mm/px · z∈[-86,+67]mm · 8 of 104 slices shown (1 of 2)]
[im 1/104]
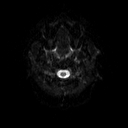
[im 15/104]
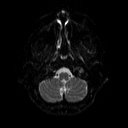
[im 30/104]
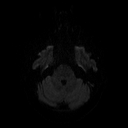
[im 45/104]
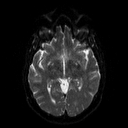
[im 59/104]
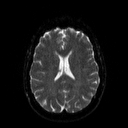
[im 74/104]
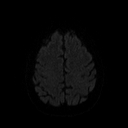
[im 89/104]
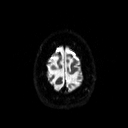
[im 104/104]
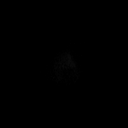

[Series 4: DWI · axial · 3.0mm · 1.80mm/px · z∈[-86,+67]mm · 4 of 51 slices shown (2 of 2)]
[im 1/51]
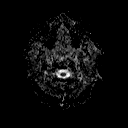
[im 17/51]
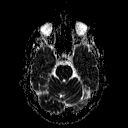
[im 34/51]
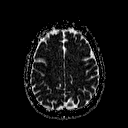
[im 51/51]
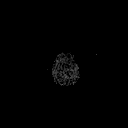

[Series 5: T2 · axial · 5.0mm · 0.51mm/px · z∈[-84,+66]mm · 2 of 24 slices shown]
[im 1/24]
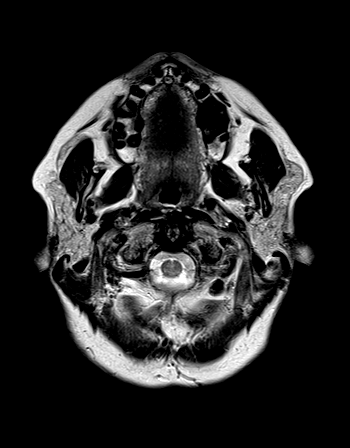
[im 24/24]
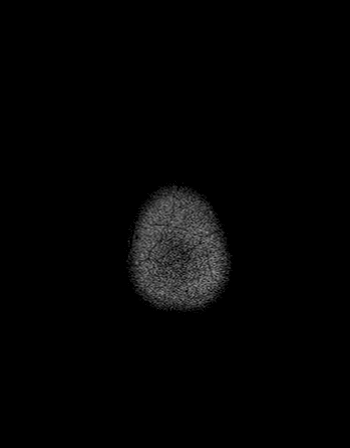

[Series 6: FLAIR · axial · 3.0mm · 0.45mm/px · z∈[-88,+68]mm · 2 of 27 slices shown]
[im 1/27]
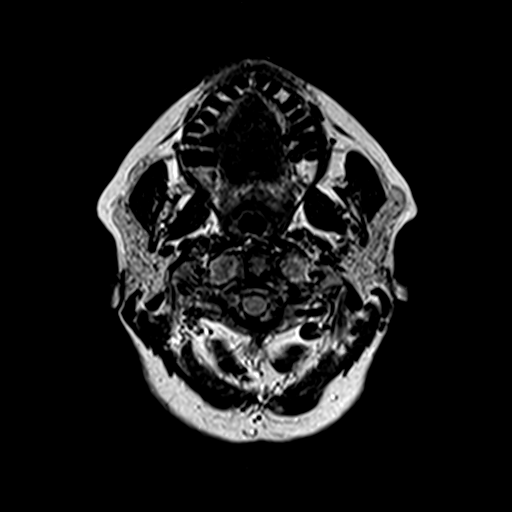
[im 27/27]
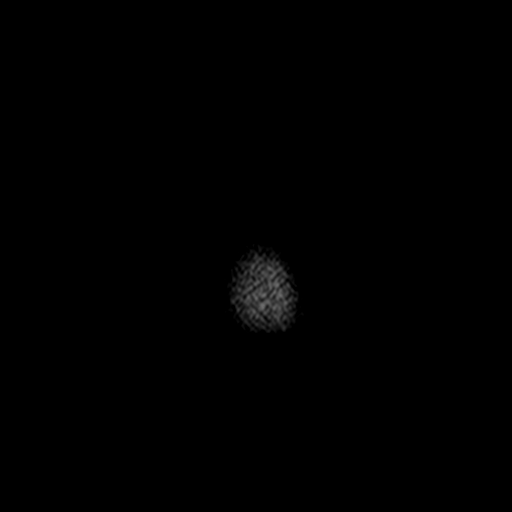

[Series 7: mip_images(sw) · axial · 16.0mm · 0.90mm/px · z∈[-76,+68]mm · 6 of 73 slices shown]
[im 1/73]
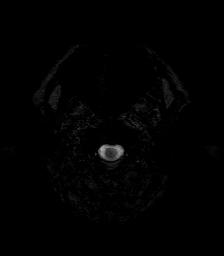
[im 15/73]
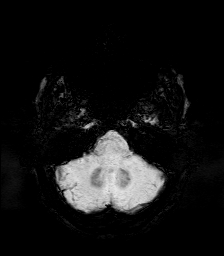
[im 29/73]
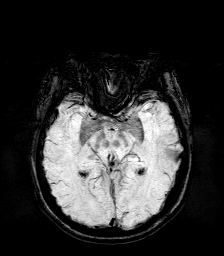
[im 44/73]
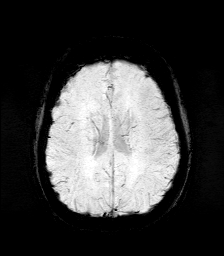
[im 58/73]
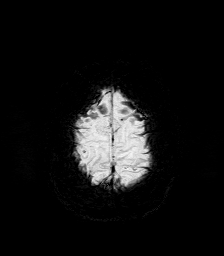
[im 73/73]
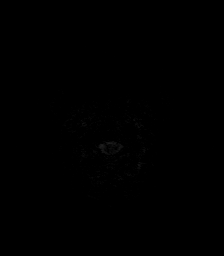

[Series 8: swi_images · axial · 2.0mm · 0.90mm/px · z∈[-83,+75]mm · 7 of 80 slices shown]
[im 1/80]
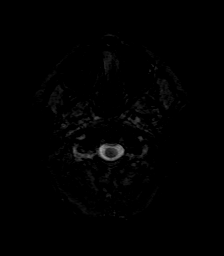
[im 14/80]
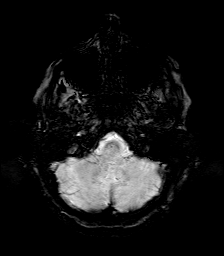
[im 27/80]
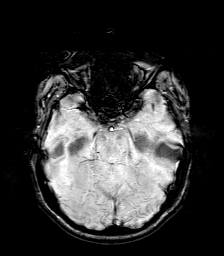
[im 40/80]
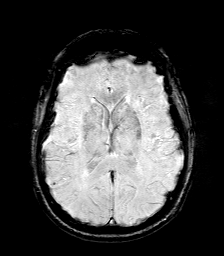
[im 53/80]
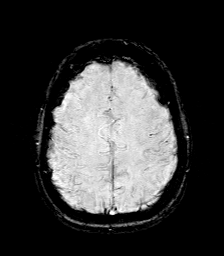
[im 66/80]
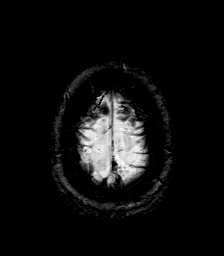
[im 80/80]
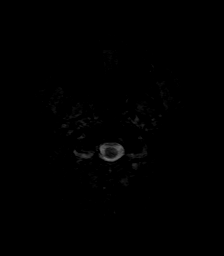

[Series 9: T1 · coronal · 3.0mm · 0.35mm/px · 1 of 18 slices shown (2 of 3)]
[im 1/18]
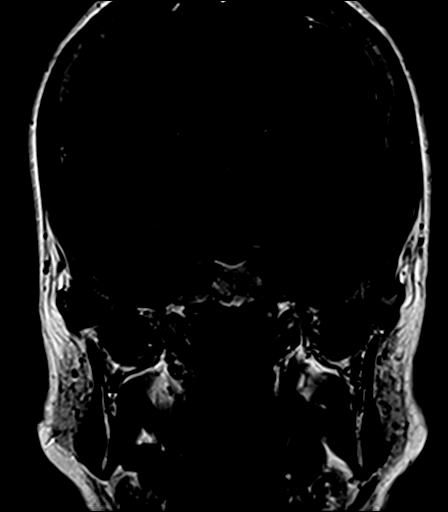

[Series 10: T1 · axial · 3.0mm · 0.35mm/px · z∈[-78,-15]mm · 2 of 20 slices shown (3 of 3)]
[im 1/20]
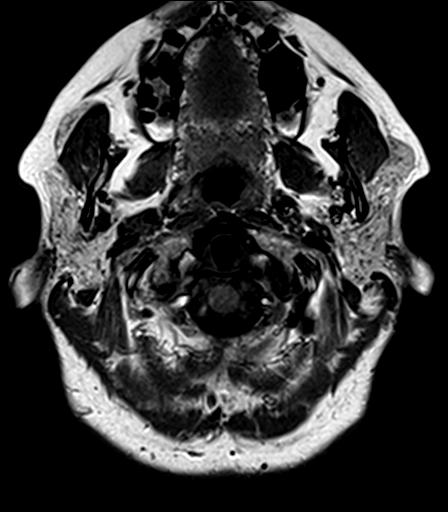
[im 20/20]
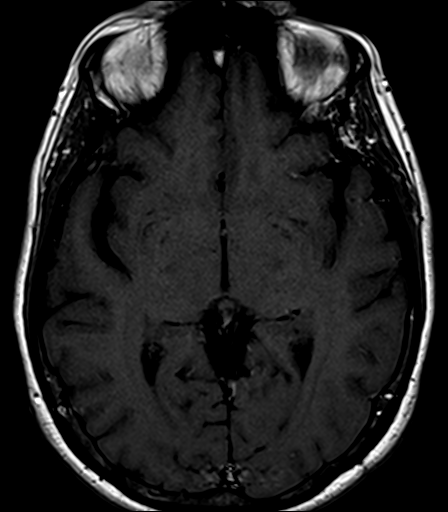

[Series 11: bSSFP · axial · 1.0mm · 0.28mm/px · z∈[-72,-41]mm · 3 of 48 slices shown]
[im 1/48]
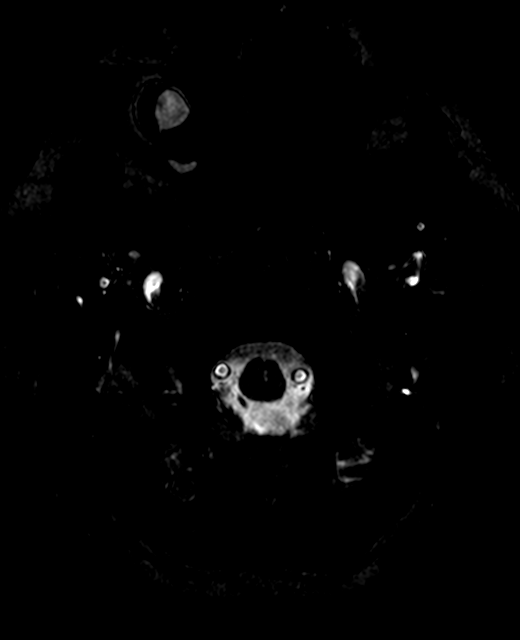
[im 16/48]
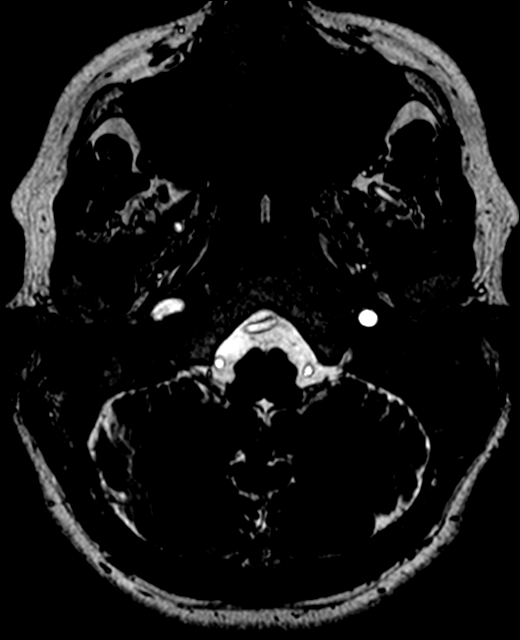
[im 32/48]
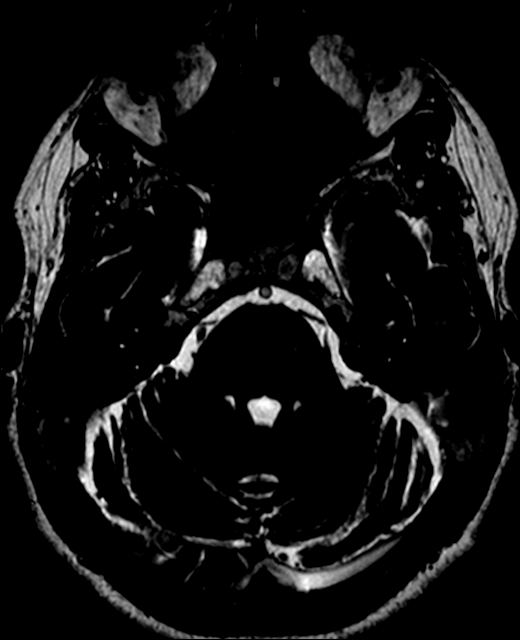

[Series 12: T1 post-contrast · coronal · 3.0mm · 0.35mm/px · 1 of 18 slices shown (1 of 2)]
[im 1/18]
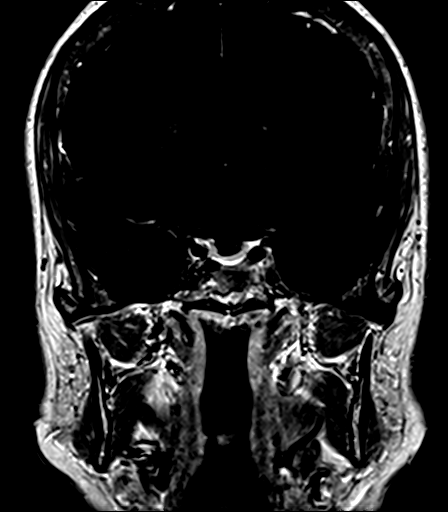

[Series 13: T1 post-contrast · axial · 3.0mm · 0.35mm/px · z∈[-78,-15]mm · 2 of 20 slices shown (2 of 2)]
[im 1/20]
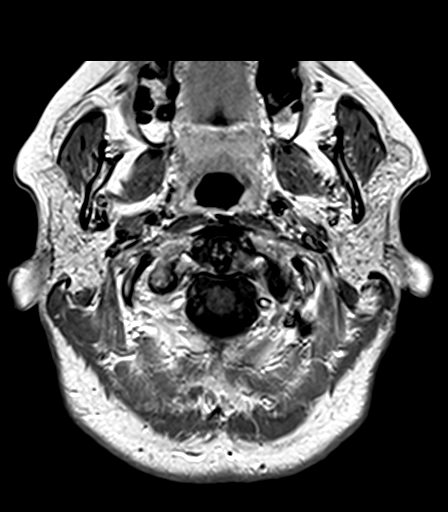
[im 20/20]
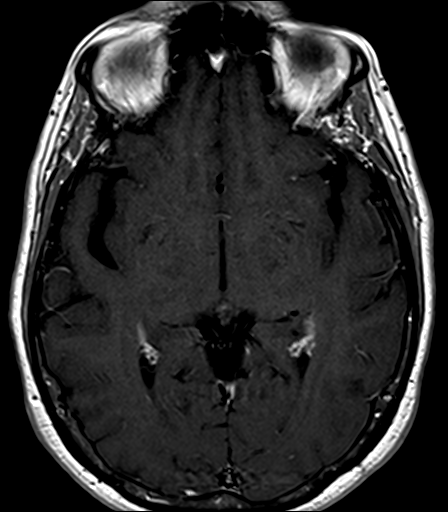

[40 of 48 positions shown; findings below may reference images not displayed]

FINDINGS: Brain: IAC protocol was performed including thin section imaging
through the posterior fossa before and after intravenous contrast.
Seventh and 8th cranial nerves are normal. Negative for vestibular
schwannoma. Basilar cisterns normal. Brainstem and cerebellum
normal. Mastoid sinus is clear bilaterally. No enhancing mass in the
posterior fossa or temporal bone.

Ventricle size normal. Negative for acute infarct, hemorrhage, or
mass lesion. No significant chronic ischemia.

Vascular: Normal arterial flow voids

Skull and upper cervical spine: Negative

Sinuses/Orbits: Mild mucosal edema paranasal sinuses. Bilateral
cataract surgery

Other: None
IMPRESSION: No acute intracranial abnormality. No cause for hearing loss
identified.

## 2019-11-07 ENCOUNTER — Ambulatory Visit: Payer: Medicare Other

## 2019-11-07 ENCOUNTER — Ambulatory Visit: Payer: Medicare Other | Attending: Internal Medicine

## 2019-11-07 DIAGNOSIS — Z23 Encounter for immunization: Secondary | ICD-10-CM | POA: Insufficient documentation

## 2019-11-07 NOTE — Progress Notes (Signed)
   Covid-19 Vaccination Clinic  Name:  Michele Suarez    MRN: [id number] DOB: 12-Dec-1950  11/07/2019  Michele Suarez was observed post Covid-19 immunization for 15 minutes without incidence. She was provided with Vaccine Information Sheet and instruction to access the V-Safe system.   Michele Suarez was instructed to call 911 with any severe reactions post vaccine: Marland Kitchen Difficulty breathing  . Swelling of your face and throat  . A fast heartbeat  . A bad rash all over your body  . Dizziness and weakness    Immunizations Administered    Name Date Dose VIS Date Route   Pfizer COVID-19 Vaccine 11/07/2019  9:43 AM 0.3 mL 08/20/2019 Intramuscular   Manufacturer: [place]   Lot: [id number]   [place]: [id number]

## 2019-12-01 ENCOUNTER — Ambulatory Visit: Payer: Medicare Other | Attending: Internal Medicine

## 2019-12-01 DIAGNOSIS — Z23 Encounter for immunization: Secondary | ICD-10-CM

## 2019-12-01 NOTE — Progress Notes (Signed)
   Covid-19 Vaccination Clinic  Name:  Michele Suarez    MRN: [id number] DOB: 11-28-1950  12/01/2019  [name] was observed post Covid-19 immunization for 15 minutes without incident. She was provided with Vaccine Information Sheet and instruction to access the V-Safe system.   [name] was instructed to call 911 with any severe reactions post vaccine: Marland Kitchen Difficulty breathing  . Swelling of face and throat  . A fast heartbeat  . A bad rash all over body  . Dizziness and weakness   Immunizations Administered    Name Date Dose VIS Date Route   Pfizer COVID-19 Vaccine 12/01/2019 12:05 PM 0.3 mL 08/20/2019 Intramuscular   Manufacturer: [place]   Lot: [id number]   [place]: [id number]

## 2019-12-21 DIAGNOSIS — Z0000 Encounter for general adult medical examination without abnormal findings: Secondary | ICD-10-CM | POA: Diagnosis not present

## 2019-12-21 DIAGNOSIS — E7849 Other hyperlipidemia: Secondary | ICD-10-CM | POA: Diagnosis not present

## 2019-12-21 DIAGNOSIS — M859 Disorder of bone density and structure, unspecified: Secondary | ICD-10-CM | POA: Diagnosis not present

## 2019-12-30 DIAGNOSIS — H35362 Drusen (degenerative) of macula, left eye: Secondary | ICD-10-CM | POA: Diagnosis not present

## 2019-12-30 DIAGNOSIS — H35033 Hypertensive retinopathy, bilateral: Secondary | ICD-10-CM | POA: Diagnosis not present

## 2019-12-30 DIAGNOSIS — H18451 Nodular corneal degeneration, right eye: Secondary | ICD-10-CM | POA: Diagnosis not present

## 2019-12-30 DIAGNOSIS — H26491 Other secondary cataract, right eye: Secondary | ICD-10-CM | POA: Diagnosis not present

## 2020-01-14 DIAGNOSIS — Z1212 Encounter for screening for malignant neoplasm of rectum: Secondary | ICD-10-CM | POA: Diagnosis not present

## 2020-02-03 DIAGNOSIS — Z008 Encounter for other general examination: Secondary | ICD-10-CM | POA: Diagnosis not present

## 2020-02-03 DIAGNOSIS — D7589 Other specified diseases of blood and blood-forming organs: Secondary | ICD-10-CM | POA: Diagnosis not present

## 2020-02-03 DIAGNOSIS — M79645 Pain in left finger(s): Secondary | ICD-10-CM | POA: Diagnosis not present

## 2020-02-03 DIAGNOSIS — R5383 Other fatigue: Secondary | ICD-10-CM | POA: Diagnosis not present

## 2020-02-03 DIAGNOSIS — M8580 Other specified disorders of bone density and structure, unspecified site: Secondary | ICD-10-CM | POA: Diagnosis not present

## 2020-02-03 DIAGNOSIS — R82998 Other abnormal findings in urine: Secondary | ICD-10-CM | POA: Diagnosis not present

## 2020-02-15 ENCOUNTER — Other Ambulatory Visit: Payer: Self-pay | Admitting: Internal Medicine

## 2020-02-15 DIAGNOSIS — E785 Hyperlipidemia, unspecified: Secondary | ICD-10-CM

## 2020-02-24 DIAGNOSIS — Z0120 Encounter for dental examination and cleaning without abnormal findings: Secondary | ICD-10-CM | POA: Diagnosis not present

## 2020-02-29 DIAGNOSIS — M79671 Pain in right foot: Secondary | ICD-10-CM | POA: Diagnosis not present

## 2020-03-07 ENCOUNTER — Other Ambulatory Visit: Payer: Medicare Other

## 2020-03-29 ENCOUNTER — Ambulatory Visit
Admission: RE | Admit: 2020-03-29 | Discharge: 2020-03-29 | Disposition: A | Discharge status: Home / Self Care | Payer: No Typology Code available for payment source | Source: Ambulatory Visit | Attending: Internal Medicine | Admitting: Internal Medicine

## 2020-03-29 DIAGNOSIS — E785 Hyperlipidemia, unspecified: Secondary | ICD-10-CM

## 2020-03-29 DIAGNOSIS — I2510 Atherosclerotic heart disease of native coronary artery without angina pectoris: Secondary | ICD-10-CM | POA: Diagnosis not present

## 2020-03-29 DIAGNOSIS — Z8249 Family history of ischemic heart disease and other diseases of the circulatory system: Secondary | ICD-10-CM | POA: Diagnosis not present

## 2020-05-04 DIAGNOSIS — H16223 Keratoconjunctivitis sicca, not specified as Sjogren's, bilateral: Secondary | ICD-10-CM | POA: Diagnosis not present

## 2020-05-04 DIAGNOSIS — H18453 Nodular corneal degeneration, bilateral: Secondary | ICD-10-CM | POA: Diagnosis not present

## 2020-05-04 DIAGNOSIS — Z961 Presence of intraocular lens: Secondary | ICD-10-CM | POA: Diagnosis not present

## 2020-05-04 DIAGNOSIS — H11052 Peripheral pterygium, progressive, left eye: Secondary | ICD-10-CM | POA: Diagnosis not present

## 2020-05-25 DIAGNOSIS — E785 Hyperlipidemia, unspecified: Secondary | ICD-10-CM | POA: Diagnosis not present

## 2020-06-13 ENCOUNTER — Ambulatory Visit: Payer: Medicare Other | Attending: Internal Medicine

## 2020-06-13 DIAGNOSIS — Z23 Encounter for immunization: Secondary | ICD-10-CM

## 2020-06-13 NOTE — Progress Notes (Signed)
   Covid-19 Vaccination Clinic  Name:  Michele Suarez    MRN: [id number] DOB: 05-12-1951  06/13/2020  Michele Suarez was observed post Covid-19 immunization for 15 minutes without incident. She was provided with Vaccine Information Sheet and instruction to access the V-Safe system.   Michele Suarez was instructed to call 911 with any severe reactions post vaccine: Marland Kitchen Difficulty breathing  . Swelling of face and throat  . A fast heartbeat  . A bad rash all over body  . Dizziness and weakness

## 2020-07-26 ENCOUNTER — Other Ambulatory Visit: Payer: Self-pay

## 2020-07-26 DIAGNOSIS — H11052 Peripheral pterygium, progressive, left eye: Secondary | ICD-10-CM | POA: Diagnosis not present

## 2020-07-26 DIAGNOSIS — H11032 Double pterygium of left eye: Secondary | ICD-10-CM | POA: Diagnosis not present

## 2020-07-26 DIAGNOSIS — H11002 Unspecified pterygium of left eye: Secondary | ICD-10-CM | POA: Diagnosis not present

## 2020-08-08 DIAGNOSIS — Z1231 Encounter for screening mammogram for malignant neoplasm of breast: Secondary | ICD-10-CM | POA: Diagnosis not present

## 2020-08-08 DIAGNOSIS — Z01419 Encounter for gynecological examination (general) (routine) without abnormal findings: Secondary | ICD-10-CM | POA: Diagnosis not present

## 2020-08-15 ENCOUNTER — Encounter: Payer: Self-pay | Admitting: Neurology

## 2020-08-16 ENCOUNTER — Ambulatory Visit: Payer: Medicare Other | Admitting: Neurology

## 2020-08-16 ENCOUNTER — Encounter: Payer: Self-pay | Admitting: Neurology

## 2020-08-16 VITALS — BP 135/82 | HR 85 | Ht 65.0 in | Wt 130.0 lb

## 2020-08-16 DIAGNOSIS — F518 Other sleep disorders not due to a substance or known physiological condition: Secondary | ICD-10-CM | POA: Diagnosis not present

## 2020-08-16 DIAGNOSIS — F5104 Psychophysiologic insomnia: Secondary | ICD-10-CM | POA: Diagnosis not present

## 2020-08-16 MED ORDER — FLUOXETINE HCL 10 MG PO TABS: 10.0000 mg | ORAL_TABLET | Freq: Every day | ORAL | 3 refills | Status: DC

## 2020-08-16 NOTE — Patient Instructions (Signed)
Fluoxetine capsules or tablets  (Anxiety, insomnia/ Mood Disorders) What is this medicine? FLUOXETINE (floo OX e teen) belongs to a class of drugs known as selective serotonin reuptake inhibitors (SSRIs). It helps to treat mood problems such as depression, obsessive compulsive disorder, and panic attacks. It can also treat certain eating disorders. This medicine may be used for other purposes; ask your health care provider or pharmacist if you have questions. COMMON BRAND NAME(S): Prozac What should I tell my health care provider before I take this medicine? They need to know if you have any of these conditions:  glaucoma  heart disease  liver disease  low levels of sodium in the blood  seizures  suicidal thoughts, plans, or attempt; a previous suicide attempt by you or a family member  take MAOIs like Carbex, Eldepryl, Marplan, Nardil, and Parnate  take medicines that treat or prevent blood clots  thyroid disease  an unusual or allergic reaction to fluoxetine, other medicines, foods, dyes, or preservatives  pregnant or trying to get pregnant  breast-feeding How should I use this medicine? Take this medicine by mouth with a glass of water. Follow the directions on the prescription label. You can take this medicine with or without food. Take your medicine at regular intervals. Do not take it more often than directed. Do not stop taking this medicine suddenly except upon the advice of your doctor. Stopping this medicine too quickly may cause serious side effects or your condition may worsen. A special MedGuide will be given to you by the pharmacist with each prescription and refill. Be sure to read this information carefully each time. Talk to your pediatrician regarding the use of this medicine in children. While this drug may be prescribed for children as young as 7 years for selected conditions, precautions do apply. Overdosage: If you think you have taken too much of this  medicine contact a poison control center or emergency room at once. NOTE: This medicine is only for you. Do not share this medicine with others. What if I miss a dose? If you miss a dose, skip the missed dose and go back to your regular dosing schedule. Do not take double or extra doses. What may interact with this medicine? Do not take this medicine with any of the following medications:  other medicines containing fluoxetine, like Sarafem or Symbyax  cisapride  dronedarone  linezolid  MAOIs like Carbex, Eldepryl, Marplan, Nardil, and Parnate  methylene blue (injected into a vein)  pimozide  thioridazine This medicine may also interact with the following medications:  Alcohol  rasagiline  ritonavir  supplements like St. John's wort, kava kava, valerian  tramadol  tryptophan  vinblastine This list may not describe all possible interactions. Give your health care provider a list of all the medicines, herbs, non-prescription drugs, or dietary supplements you use. Also tell them if you smoke, drink alcohol, or use illegal drugs. Some items may interact with your medicine. What should I watch for while using this medicine? Tell your doctor if your symptoms do not get better or if they get worse. Visit your doctor or health care professional for regular checks on your progress. Because it may take several weeks to see the full effects of this medicine, it is important to continue your treatment as prescribed by your doctor. Patients and their families should watch out for new or worsening thoughts of suicide or depression. Also watch out for sudden changes in feelings such as feeling anxious, agitated, panicky, irritable, hostile,  aggressive, impulsive, severely restless, overly excited and hyperactive, or not being able to sleep. If this happens, especially at the beginning of treatment or after a change in dose, call your health care professional. Dennis Bast may get drowsy or dizzy. Do  not drive, use machinery, or do anything that needs mental alertness until you know how this medicine affects you. Do not stand or sit up quickly, especially if you are an older patient. This reduces the risk of dizzy or fainting spells. Alcohol may interfere with the effect of this medicine. Avoid alcoholic drinks. Your mouth may get dry. Chewing sugarless gum or sucking hard candy, and drinking plenty of water may help. Contact your doctor if the problem does not go away or is severe. This medicine may affect blood sugar levels. If you have diabetes, check with your doctor or health care professional before you change your diet or the dose of your diabetic medicine. What side effects may I notice from receiving this medicine? Side effects that you should report to your doctor or health care professional as soon as possible:  allergic reactions like skin rash, itching or hives, swelling of the face, lips, or tongue  anxious  black, tarry stools  breathing problems  changes in vision  confusion  elevated mood, decreased need for sleep, racing thoughts, impulsive behavior  eye pain  fast, irregular heartbeat  feeling faint or lightheaded, falls  feeling agitated, angry, or irritable  hallucination, loss of contact with reality  loss of balance or coordination  loss of memory  painful or prolonged erections  restlessness, pacing, inability to keep still  seizures  stiff muscles  suicidal thoughts or other mood changes  trouble sleeping  unusual bleeding or bruising  unusually weak or tired  vomiting Side effects that usually do not require medical attention (report to your doctor or health care professional if they continue or are bothersome):  change in appetite or weight  change in sex drive or performance  diarrhea  dry mouth  headache  increased sweating  nausea  tremors This list may not describe all possible side effects. Call your doctor for  medical advice about side effects. You may report side effects to FDA at 1-800-FDA-1088. Where should I keep my medicine? Keep out of the reach of children. Store at room temperature between 15 and 30 degrees C (59 and 86 degrees F). Throw away any unused medicine after the expiration date. NOTE: This sheet is a summary. It may not cover all possible information. If you have questions about this medicine, talk to your doctor, pharmacist, or health care provider.  2020 Elsevier/Gold Standard (2018-04-16 11:56:53) Insomnia Insomnia is a sleep disorder that makes it difficult to fall asleep or stay asleep. Insomnia can cause fatigue, low energy, difficulty concentrating, mood swings, and poor performance at work or school. There are three different ways to classify insomnia:  Difficulty falling asleep.  Difficulty staying asleep.  Waking up too early in the morning. Any type of insomnia can be long-term (chronic) or short-term (acute). Both are common. Short-term insomnia usually lasts for three months or less. Chronic insomnia occurs at least three times a week for longer than three months. What are the causes? Insomnia may be caused by another condition, situation, or substance, such as:  Anxiety.  Certain medicines.  Gastroesophageal reflux disease (GERD) or other gastrointestinal conditions.  Asthma or other breathing conditions.  Restless legs syndrome, sleep apnea, or other sleep disorders.  Chronic pain.  Menopause.  Stroke.  Abuse of alcohol, tobacco, or illegal drugs.  Mental health conditions, such as depression.  Caffeine.  Neurological disorders, such as Alzheimer's disease.  An overactive thyroid (hyperthyroidism). Sometimes, the cause of insomnia may not be known. What increases the risk? Risk factors for insomnia include:  Gender. Women are affected more often than men.  Age. Insomnia is more common as you get older.  Stress.  Lack of  exercise.  Irregular work schedule or working night shifts.  Traveling between different time zones.  Certain medical and mental health conditions. What are the signs or symptoms? If you have insomnia, the main symptom is having trouble falling asleep or having trouble staying asleep. This may lead to other symptoms, such as:  Feeling fatigued or having low energy.  Feeling nervous about going to sleep.  Not feeling rested in the morning.  Having trouble concentrating.  Feeling irritable, anxious, or depressed. How is this diagnosed? This condition may be diagnosed based on:  Your symptoms and medical history. Your health care provider may ask about: ? Your sleep habits. ? Any medical conditions you have. ? Your mental health.  A physical exam. How is this treated? Treatment for insomnia depends on the cause. Treatment may focus on treating an underlying condition that is causing insomnia. Treatment may also include:  Medicines to help you sleep.  Counseling or therapy.  Lifestyle adjustments to help you sleep better. Follow these instructions at home: Eating and drinking   Limit or avoid alcohol, caffeinated beverages, and cigarettes, especially close to bedtime. These can disrupt your sleep.  Do not eat a large meal or eat spicy foods right before bedtime. This can lead to digestive discomfort that can make it hard for you to sleep. Sleep habits   Keep a sleep diary to help you and your health care provider figure out what could be causing your insomnia. Write down: ? When you sleep. ? When you wake up during the night. ? How well you sleep. ? How rested you feel the next day. ? Any side effects of medicines you are taking. ? What you eat and drink.  Make your bedroom a dark, comfortable place where it is easy to fall asleep. ? Put up shades or blackout curtains to block light from outside. ? Use a white noise machine to block noise. ? Keep the temperature  cool.  Limit screen use before bedtime. This includes: ? Watching TV. ? Using your smartphone, tablet, or computer.  Stick to a routine that includes going to bed and waking up at the same times every day and night. This can help you fall asleep faster. Consider making a quiet activity, such as reading, part of your nighttime routine.  Try to avoid taking naps during the day so that you sleep better at night.  Get out of bed if you are still awake after 15 minutes of trying to sleep. Keep the lights down, but try reading or doing a quiet activity. When you feel sleepy, go back to bed. General instructions  Take over-the-counter and prescription medicines only as told by your health care provider.  Exercise regularly, as told by your health care provider. Avoid exercise starting several hours before bedtime.  Use relaxation techniques to manage stress. Ask your health care provider to suggest some techniques that may work well for you. These may include: ? Breathing exercises. ? Routines to release muscle tension. ? Visualizing peaceful scenes.  Make sure that you drive carefully. Avoid driving if you  feel very sleepy.  Keep all follow-up visits as told by your health care provider. This is important. Contact a health care provider if:  You are tired throughout the day.  You have trouble in your daily routine due to sleepiness.  You continue to have sleep problems, or your sleep problems get worse. Get help right away if:  You have serious thoughts about hurting yourself or someone else. If you ever feel like you may hurt yourself or others, or have thoughts about taking your own life, get help right away. You can go to your nearest emergency department or call:  Your local emergency services (911 in the [place]  A suicide crisis helpline, such as the [facility] at 818-444-6976. This is open 24 hours a day. Summary  Insomnia is a sleep disorder that  makes it difficult to fall asleep or stay asleep.  Insomnia can be long-term (chronic) or short-term (acute).  Treatment for insomnia depends on the cause. Treatment may focus on treating an underlying condition that is causing insomnia.  Keep a sleep diary to help you and your health care provider figure out what could be causing your insomnia. This information is not intended to replace advice given to you by your health care provider. Make sure you discuss any questions you have with your health care provider. Document Revised: 08/08/2017 Document Reviewed: 06/05/2017 Elsevier Patient Education  2020 [employer].

## 2020-08-16 NOTE — Progress Notes (Signed)
SLEEP MEDICINE CLINIC    Provider:  Larey Seat, MD  Primary Care Physician:  Leanna Battles, MD [place] [address]     Referring Provider: Leanna Battles, [address] [facility],  [place] [address]          Chief Complaint according to patient   Patient presents with:    . New Patient (Initial Visit)     pt alone, rm 62. presents todayshe has struggled with insomnia for several years. at one point she was on ambien for lengthy time and weaned off. her pcp wanted to restart that but she didnt want to go back on that particular med because she didnt like way made her feel. states that she had a SS >15 yrs ago.She voices that she is having no worries, no debt, a happy life, nothing that would cause her to stay awake.       HISTORY OF PRESENT ILLNESS:  Michele Suarez is a 68  year- old White or Caucasian female patient seen on 08/16/2020 from Dr Philip Aspen for a sleep consultation .  Chief concern according to patient :  " my sleeplessness started in 1993 when I separated form my husband, the father of my children. I had a hiatal hernia and( not knowing then ) and took every night  Tylenol Pm and later wass changed to Ambien (1998). And after 12 years stopped cold [place] - and after a couple of month needed another sleep aid. Now taking melatonin , but it takes 30 mg). I go to bed and have anxiety - not about external stressors, just anxiety about sleep.     I have the pleasure of seeing Michele Suarez today, a right-handed White or Caucasian female who has a  has a past medical history of Depression, GERD (gastroesophageal reflux disease), HSV infection, Hyperlipidemia, Insomnia, Migraines, and SVD (spontaneous vaginal delivery).   Sleep relevant medical history: chronic insomnia- anxiety at sleep initiation.   Family medical /sleep history: no other family member on CPAP with OSA, or insomnias.    Social history:  Patient is retired from Teacher, early years/pre,  and lives in a household  alone.  She is engaged and travel a lot.  She has 3 adult children, 8 grandchildren.  The patient currently works occassionally. Pets are not present. Tobacco use; never. ETOH use - one a day , Caffeine intake is very low, 1/2 cup in AM- Regular exercise in form of walking .Marland Kitchen   She is on peleton- and a regular exerciser, loves ballroom dancing. Walking daily.     Sleep habits are as follows: The patient's dinner time is between 6.30 PM. The patient goes to bed at 10 PM and continues  to sleep for 4-5 hours.  That is her average.  The preferred sleep position is supine  with the support of 1-2 pillows. Dreams are reportedly frequent.Benedict Needy between 4-5.45 AM   and that  is the usual rise time. The patient wakes up spontaneously.  She reports feeling to anxious to be able to return to sleep- but  feeling somewhat refreshed / restored in AM.   Review of Systems: Out of a complete 14 system review, the patient complains of only the following symptoms, and all other reviewed systems are negative.:  Fatigue, sleepiness , snoring, fragmented sleep, Insomnia-    How likely are you to doze in the following situations: 0 = not likely, 1 = slight chance, 2 = moderate  chance, 3 = high chance   Sitting and Reading? Watching Television? Sitting inactive in a public place (theater or meeting)? As a passenger in a car for an hour without a break? Lying down in the afternoon when circumstances permit? Sitting and talking to someone? Sitting quietly after lunch without alcohol? In a car, while stopped for a few minutes in traffic?   Total = 1/ 24 points   FSS endorsed at 9/ 63 points.   Anxiety, energized, but takes adderall every morning for focus.   Social History   Socioeconomic History  . Marital status: Widowed    Spouse name: Not on file  . Number of children: Not on file  . Years of education: Not on file  . Highest education level: Not on file   Occupational History  . Not on file  Tobacco Use  . Smoking status: Never Smoker  . Smokeless tobacco: Never Used  Substance and Sexual Activity  . Alcohol use: Yes    Alcohol/week: 7.0 standard drinks    Types: 7 Standard drinks or equivalent per week  . Drug use: No  . Sexual activity: Not on file  Other Topics Concern  . Not on file  Social History Narrative  . Not on file   Social Determinants of Health   Financial Resource Strain:   . Difficulty of Paying Living Expenses: Not on file  Food Insecurity:   . Worried About Charity fundraiser in the Last Year: Not on file  . Ran Out of Food in the Last Year: Not on file  Transportation Needs:   . Lack of Transportation (Medical): Not on file  . Lack of Transportation (Non-Medical): Not on file  Physical Activity:   . Days of Exercise per Week: Not on file  . Minutes of Exercise per Session: Not on file  Stress:   . Feeling of Stress : Not on file  Social Connections:   . Frequency of Communication with Friends and Family: Not on file  . Frequency of Social Gatherings with Friends and Family: Not on file  . Attends Religious Services: Not on file  . Active Member of Clubs or Organizations: Not on file  . Attends Archivist Meetings: Not on file  . Marital Status: Not on file    Family History  Problem Relation Age of Onset  . Heart failure Father   . Breast cancer Mother     Past Medical History:  Diagnosis Date  . Depression   . GERD (gastroesophageal reflux disease)   . HSV infection   . Hyperlipidemia   . Insomnia   . Migraines   . SVD (spontaneous vaginal delivery)    x 3    Past Surgical History:  Procedure Laterality Date  . BREAST SURGERY     reduction  . COLONOSCOPY    . EYE SURGERY Bilateral    cataract  . SHOULDER SURGERY Bilateral    x 2  . UPPER GI ENDOSCOPY    . VAGINAL HYSTERECTOMY    . VULVECTOMY N/A 07/17/2016   Procedure: WIDE Local Vulvar EXCISION;  Surgeon: Brien Few, MD;  Location: [facility];  Service: Gynecology;  Laterality: N/A;  . WISDOM TOOTH EXTRACTION       Current Outpatient Medications on File Prior to Visit  Medication Sig Dispense Refill  . amphetamine-dextroamphetamine (ADDERALL) 10 MG tablet Take 10 mg by mouth daily with breakfast.    . Ascorbic Acid (VITAMIN C PO) Take 1 tablet by  mouth daily.     Marland Kitchen aspirin EC 81 MG tablet Take 81 mg by mouth daily.    . Cholecalciferol (VITAMIN D PO) Take 1 tablet by mouth daily.     . cycloSPORINE (RESTASIS) 0.05 % ophthalmic emulsion 1 drop 2 (two) times daily.    Marland Kitchen oxyCODONE-acetaminophen (ROXICET) 5-325 MG tablet Take 1-2 tablets by mouth every 4 (four) hours as needed for severe pain. 20 tablet 0  . prednisoLONE acetate (PRED FORTE) 1 % ophthalmic suspension Place 1 drop into the left eye 4 (four) times daily.    Marland Kitchen REPATHA SURECLICK 147 MG/ML SOAJ Inject 140 mLs into the skin every 14 (fourteen) days.    . valACYclovir (VALTREX) 500 MG tablet Take 500 mg by mouth daily as needed.    Marland Kitchen VITAMIN E PO Take 1 tablet by mouth daily.      No current facility-administered medications on file prior to visit.   Physical exam:  Today's Vitals   08/16/20 1014  BP: 135/82  Pulse: 85  Weight: 130 lb (59 kg)  Height: 5\' 5"  (1.651 m)   Body mass index is 21.63 kg/m.   Wt Readings from Last 3 Encounters:  08/16/20 130 lb (59 kg)  07/15/16 135 lb 6 oz (61.4 kg)     Ht Readings from Last 3 Encounters:  08/16/20 5\' 5"  (1.651 m)  07/15/16 5\' 5"  (1.651 m)      General: The patient is awake, alert and appears not in acute distress. The patient is well groomed. Head: Normocephalic, atraumatic. Neck is supple. Mallampati 3,  neck circumference:  inches . Nasal airflow patent.  Retrognathia is not seen.  Dental status: 13 Cardiovascular:  Regular rate and cardiac rhythm by pulse,  without distended neck veins. Respiratory: Lungs are clear to auscultation.  Skin:  Without evidence of ankle edema, or  rash. Trunk: The patient's posture is erect.   Neurologic exam : The patient is awake and alert, oriented to place and time.   Memory subjective described as intact.  Attention span & concentration ability appears normal.  Speech is fluent,  without  dysarthria, dysphonia or aphasia.  Mood and affect are chipper.    Cranial nerves: no loss of smell or taste reported  Pupils are equal and briskly reactive to light. 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 No Diplopia. Visual fields by finger perimetry are intact. Hearing was intact to soft voice and finger rubbing.    Facial sensation intact to fine touch.  Facial motor strength is symmetric and tongue and uvula move midline.  Neck ROM : rotation, tilt and flexion extension were normal for age and shoulder shrug was symmetrical.    Motor exam:  Symmetric bulk, tone and ROM.   Normal tone without cog wheeling, symmetric grip strength .   Sensory:  Fine touch, pinprick and vibration were tested  and  normal.  Proprioception tested in the upper extremities was normal.   Coordination: Rapid alternating movements in the fingers/hands were of normal speed.  The Finger-to-nose maneuver was intact without evidence of ataxia, dysmetria or tremor.   Gait and station: Patient could rise unassisted from a seated position, walked without assistive device.  Stance is of normal width/ base and the patient turned with 3 steps.  Toe and heel walk were deferred.  Deep tendon reflexes: in the upper and lower extremities are  symmetric and intact.  Babinski response was deferred.      After spending a total time of  35  minutes face to face and additional time for physical and neurologic examination, review of laboratory studies,  personal review of imaging studies, reports and results of other testing and review of referral  information / records as far as provided in visit, I have established the following assessments:  Mrs. Nasim describes an onset rather acutely of insomnia related to a domestic situation when she separated and finally divorced.  At the time she had to unbeknownst to her hiatal hernia and she broke up with heartburn felt it may be actually her heart but was not get up for this condition.  She had 3 young children at the time had just become a single mom and there were several stressors and also anxieties related to the situation.  She remembers that through school years in college she seemed not to have had any difficulty sleeping at least she would get 7 hours of sleep easily ever since 1997 however she has not slept easily without a sleep aid but if she gains at 5-hour nocturnal sleep she is considering herself pretty happy.  She functions well during daytime she does not feel a lack of energy she does deny any kind of excessive daytime sleepiness but she is also on Adderall low-dose 10 mg daily which may mask some of the symptoms should they be there.  She had tried doxepin at night 10 mg but no benefit from it she had been built before on Ambien and over-the-counter Tylenol PM and other Benadryl like nonprescription medication.  Her medication list does not show any of the medications to be likely to produce daytime sleepiness or insomnia.  Melatonin at 10 mg has not given her relief either she has tried 2 of these tablets which I would consider a little bit too much.  She is followed also by Deirdre Pippins for GI, by Dr. Genevieve Norlander for gynecology Christen Butter for cardiology, and [facility].   1) I have wondered about the presence of a short sleeper syndrome here unless there is an underlying severe anxiety disorder that has just qualified and has caused a cycle illogical reason for this chronic insomnia my second guess is that she may be genetically predisposed short sleeper syndrome usually however manifests  already by college age and that is not the case here.  What is surprising is how well she does with 5 hours of sleep or less every night.  She does not snore , has never been told she has apnea. No RLS.   I will see if I can do the genetic test for short sleeper syndrome I also would strongly recommend to use a nonaddictive anxiety treatment and usually that is cognitive behavioral therapy to look at and other possible causes of anxiety is a patterns that introduce palpitations the feeling of shortness of breath a feeling of nausea or discomfort.   If there are any of these triggers present even today in her perfect  life that she is not aware of, I think that could benefit her the most..     My Plan is to proceed with:  1) SSRI ( not benzo, not ambien- zoloft or prozac).  2)  Consider cognitive BT 3) consider short sleeper work up. [id number].  I would like to thank Leanna Battles, MD and Leanna Battles, [place],  [place] [address] for allowing me to  meet with and to take care of this pleasant patient.     CC: I will share my notes with PCP .  Electronically signed by: Larey Seat, MD 08/16/2020 10:24 AM  [facility] Board certified by The [employer] of Sleep Medicine and Diplomate of the [employer] of Sleep Medicine. Board certified In Neurology through the [place], Fellow of the [employer] of Neurology. Medical Director of [facility].

## 2020-09-09 ENCOUNTER — Other Ambulatory Visit: Payer: Self-pay | Admitting: Neurology

## 2020-09-12 ENCOUNTER — Telehealth: Payer: Self-pay | Admitting: Neurology

## 2020-09-12 MED ORDER — FLUOXETINE HCL 20 MG PO CAPS: 20.0000 mg | ORAL_CAPSULE | Freq: Every day | ORAL | 0 refills | Status: DC

## 2020-09-12 NOTE — Telephone Encounter (Signed)
Pt called, FLUoxetine (PROZAC) 10 MG tablet is not working. Has not made any different. What is the next option. Also need to change the pharmacy to [facility] [address] [place], [place] [address]

## 2020-09-12 NOTE — Telephone Encounter (Signed)
Spoke with Dr Vickey Huger who states that with the low dose of prozac she is on, there is room to increase the dose and see if there is benefit. She wanted to make sure the patient also knew that the medication can take time to become effective.  Dr Dohmeier states that a referral to CBT can be placed for assessing insomnia treatment. She also recommended attempting a HST to see if able to capture sleep and if organic sleep disorder is present.  At this time the patient would like to hold off on sleep study and referral. She will see if the medication increase helps. She is going to be out of town for 6 wks and states that she will contact us upon her return to update on how things are and if needing to move forward with next steps. Pt was appreciative for the call back.

## 2020-09-12 NOTE — Telephone Encounter (Signed)
Lets go to 20 mg Prozac and suggest referral to CBT. I also would like to get a sleep study, I had wanted to wait until her sleep improves, however it is important to rule out other organic factors. CD

## 2020-09-14 ENCOUNTER — Other Ambulatory Visit: Payer: Self-pay | Admitting: Neurology

## 2020-09-27 DIAGNOSIS — L814 Other melanin hyperpigmentation: Secondary | ICD-10-CM | POA: Diagnosis not present

## 2020-09-27 DIAGNOSIS — L821 Other seborrheic keratosis: Secondary | ICD-10-CM | POA: Diagnosis not present

## 2020-09-27 DIAGNOSIS — D225 Melanocytic nevi of trunk: Secondary | ICD-10-CM | POA: Diagnosis not present

## 2020-09-27 DIAGNOSIS — Z85828 Personal history of other malignant neoplasm of skin: Secondary | ICD-10-CM | POA: Diagnosis not present

## 2020-11-22 DIAGNOSIS — K219 Gastro-esophageal reflux disease without esophagitis: Secondary | ICD-10-CM | POA: Diagnosis not present

## 2020-11-22 DIAGNOSIS — E65 Localized adiposity: Secondary | ICD-10-CM | POA: Diagnosis not present

## 2020-11-22 DIAGNOSIS — E785 Hyperlipidemia, unspecified: Secondary | ICD-10-CM | POA: Diagnosis not present

## 2020-11-22 DIAGNOSIS — Z01818 Encounter for other preprocedural examination: Secondary | ICD-10-CM | POA: Diagnosis not present

## 2020-12-11 DIAGNOSIS — Z961 Presence of intraocular lens: Secondary | ICD-10-CM | POA: Diagnosis not present

## 2020-12-11 DIAGNOSIS — Z9889 Other specified postprocedural states: Secondary | ICD-10-CM | POA: Diagnosis not present

## 2020-12-11 DIAGNOSIS — H16223 Keratoconjunctivitis sicca, not specified as Sjogren's, bilateral: Secondary | ICD-10-CM | POA: Diagnosis not present

## 2021-01-01 DIAGNOSIS — H35362 Drusen (degenerative) of macula, left eye: Secondary | ICD-10-CM | POA: Diagnosis not present

## 2021-01-01 DIAGNOSIS — H35033 Hypertensive retinopathy, bilateral: Secondary | ICD-10-CM | POA: Diagnosis not present

## 2021-01-01 DIAGNOSIS — H04123 Dry eye syndrome of bilateral lacrimal glands: Secondary | ICD-10-CM | POA: Diagnosis not present

## 2021-01-01 DIAGNOSIS — H35013 Changes in retinal vascular appearance, bilateral: Secondary | ICD-10-CM | POA: Diagnosis not present

## 2021-01-09 DIAGNOSIS — H903 Sensorineural hearing loss, bilateral: Secondary | ICD-10-CM | POA: Diagnosis not present

## 2021-01-09 DIAGNOSIS — H9313 Tinnitus, bilateral: Secondary | ICD-10-CM | POA: Diagnosis not present

## 2021-02-19 DIAGNOSIS — Z0000 Encounter for general adult medical examination without abnormal findings: Secondary | ICD-10-CM | POA: Diagnosis not present

## 2021-02-19 DIAGNOSIS — E785 Hyperlipidemia, unspecified: Secondary | ICD-10-CM | POA: Diagnosis not present

## 2021-02-20 ENCOUNTER — Encounter: Payer: Self-pay | Admitting: Neurology

## 2021-02-20 ENCOUNTER — Ambulatory Visit: Payer: Medicare Other | Admitting: Neurology

## 2021-02-20 ENCOUNTER — Other Ambulatory Visit: Payer: Self-pay

## 2021-02-20 VITALS — BP 128/81 | HR 75 | Ht 65.0 in | Wt 127.0 lb

## 2021-02-20 DIAGNOSIS — F518 Other sleep disorders not due to a substance or known physiological condition: Secondary | ICD-10-CM | POA: Diagnosis not present

## 2021-02-20 DIAGNOSIS — Q8789 Other specified congenital malformation syndromes, not elsewhere classified: Secondary | ICD-10-CM | POA: Diagnosis not present

## 2021-02-20 MED ORDER — TRAZODONE HCL 50 MG PO TABS: 25.0000 mg | ORAL_TABLET | Freq: Every evening | ORAL | 5 refills | Status: AC | PRN

## 2021-02-20 NOTE — Patient Instructions (Addendum)
[id number] gene testing - I am researching how to get this test done. We will contact the lab.  Phone number: [phone] Fax number: [phone] Email: [email or web address] Website: [email or web address]

## 2021-02-20 NOTE — Progress Notes (Signed)
SLEEP MEDICINE CLINIC    Provider:  Larey Seat, MD  Primary Care Physician:  Leanna Battles, MD [place] [address]     Referring Provider: Leanna Battles, [place],  [place] [address]          Chief Complaint according to patient   Patient presents with:     New Patient (Initial Visit)     pt alone, rm 11. presents todayshe has struggled with insomnia for several years. at one point she was on ambien for lengthy time and weaned off. her pcp wanted to restart that but she didnt want to go back on that particular med because she didnt like way made her feel. states that she had a SS >15 yrs ago.She voices that she is having no worries, no debt, a happy life, nothing that would cause her to stay awake.  Pt alone, rm 10. Presents as a f/u. She was prescribed prozac 20 mg to help with sleep. She states that after 6 weeks of no benefit she stopped the medication. Pt states that she still not getting sleep. States that she averages about 4/5 hrs of sleep.      HISTORY OF PRESENT ILLNESS:  Michele Suarez is a 70  year- old female patient seen on 02/20/2021 from Dr Philip Aspen for a sleep consultation .  Chief concern according to patient : " My sleeplessness started in 1993 when I separated form my husband, the father of my children. I had a hiatal hernia and( not knowing then ) and took every night  Tylenol Pm and later wass changed to Ambien (1998). And after 12 years stopped cold [place] - and after a couple of month needed another sleep aid. Now taking melatonin , but it takes 30 mg). I go to bed and have anxiety - not about external stressors, just anxiety about sleep. Michele Suarez has tried Prozac after our initial consultation from 16 August 2020 and it has not changed her sleep pattern her ability to go to sleep or sleep longer.  She still averages 4 to 5 hours of sleep.  Since she is no longer on an SSRI I would like to try trazodone with her and I  may send an HLA test for short sleeper syndrome.      I have the pleasure of seeing Michele Suarez today, a right-handed Caucasian female who has a  has a past medical history of Depression, GERD (gastroesophageal reflux disease), HSV infection, Hyperlipidemia, Insomnia, Migraines, and SVD (spontaneous vaginal delivery).   Sleep relevant medical history: chronic insomnia- anxiety at sleep initiation.   Family medical /sleep history: no other family member on CPAP with OSA, or insomnias.    Social history:  Patient is retired from Pharmacist, hospital,  and lives in a household  alone.  She is engaged and travel a lot.  She has 3 adult children, 8 grandchildren.  The patient currently works occassionally. Pets are not present. Tobacco use; never. ETOH use - one a day , Caffeine intake is very low, 1/2 cup in AM- Regular exercise in form of walking .Marland Kitchen   She is on peleton- and a regular exerciser, loves ballroom dancing. Walking daily.     Sleep habits are as follows: The patient's dinner time is between 6.30 PM. The patient goes to bed at 10 PM and continues  to sleep for 4-5 hours.  That is her average.  The preferred sleep position is supine  with the support of 1-2 pillows. Dreams are reportedly frequent.Benedict Needy between 4-5.45 AM   and that  is the usual rise time. The patient wakes up spontaneously.  She reports feeling to anxious to be able to return to sleep- but  feeling somewhat refreshed / restored in AM.   Review of Systems: Out of a complete 14 system review, the patient complains of only the following symptoms, and all other reviewed systems are negative.:  Fatigue, sleepiness , snoring, fragmented sleep, Insomnia-    How likely are you to doze in the following situations: 0 = not likely, 1 = slight chance, 2 = moderate chance, 3 = high chance   Sitting and Reading? Watching Television? Sitting inactive in a public place (theater or meeting)? As a passenger in a car for an  hour without a break? Lying down in the afternoon when circumstances permit? Sitting and talking to someone? Sitting quietly after lunch without alcohol? In a car, while stopped for a few minutes in traffic?   Total = 1/ 24 points   FSS endorsed at 9/ 63 points.   Anxiety, energized, but takes adderall every morning for focus.   Social History   Socioeconomic History   Marital status: Widowed    Spouse name: Not on file   Number of children: Not on file   Years of education: Not on file   Highest education level: Not on file  Occupational History   Not on file  Tobacco Use   Smoking status: Never   Smokeless tobacco: Never  Substance and Sexual Activity   Alcohol use: Yes    Alcohol/week: 7.0 standard drinks    Types: 7 Standard drinks or equivalent per week   Drug use: No   Sexual activity: Not on file  Other Topics Concern   Not on file  Social History Narrative   Not on file   Social Determinants of Health   Financial Resource Strain: Not on file  Food Insecurity: Not on file  Transportation Needs: Not on file  Physical Activity: Not on file  Stress: Not on file  Social Connections: Not on file    Family History  Problem Relation Age of Onset   Heart failure Father    Breast cancer Mother     Past Medical History:  Diagnosis Date   Depression    GERD (gastroesophageal reflux disease)    HSV infection    Hyperlipidemia    Insomnia    Migraines    SVD (spontaneous vaginal delivery)    x 3    Past Surgical History:  Procedure Laterality Date   BREAST SURGERY     reduction   COLONOSCOPY     EYE SURGERY Bilateral    cataract   SHOULDER SURGERY Bilateral    x 2   UPPER GI ENDOSCOPY     VAGINAL HYSTERECTOMY     VULVECTOMY N/A 07/17/2016   Procedure: WIDE Local Vulvar EXCISION;  Surgeon: Brien Few, MD;  Location: [facility];  Service: Gynecology;  Laterality: N/A;   WISDOM TOOTH EXTRACTION       Current Outpatient Medications on File Prior to  Visit  Medication Sig Dispense Refill   amphetamine-dextroamphetamine (ADDERALL) 10 MG tablet Take 10 mg by mouth daily with breakfast.     Ascorbic Acid (VITAMIN C PO) Take 1 tablet by mouth daily.      ascorbic acid (VITAMIN C) 1000 MG tablet Take by mouth.     Cholecalciferol (VITAMIN D PO)  Take 1 tablet by mouth daily.      cycloSPORINE (RESTASIS) 0.05 % ophthalmic emulsion 1 drop 2 (two) times daily.     REPATHA SURECLICK 867 MG/ML SOAJ Inject 140 mLs into the skin every 14 (fourteen) days.     valACYclovir (VALTREX) 500 MG tablet Take 500 mg by mouth daily as needed.     VITAMIN E PO Take 1 tablet by mouth daily.      No current facility-administered medications on file prior to visit.   Physical exam:  Today's Vitals   02/20/21 0811  BP: 128/81  Pulse: 75  Weight: 127 lb (57.6 kg)  Height: '5\' 5"'  (1.651 m)   Body mass index is 21.13 kg/m.   Wt Readings from Last 3 Encounters:  02/20/21 127 lb (57.6 kg)  08/16/20 130 lb (59 kg)  07/15/16 135 lb 6 oz (61.4 kg)     Ht Readings from Last 3 Encounters:  02/20/21 '5\' 5"'  (1.651 m)  08/16/20 '5\' 5"'  (1.651 m)  07/15/16 '5\' 5"'  (1.651 m)      General: The patient is awake, alert and appears not in acute distress. The patient is well groomed. Head: Normocephalic, atraumatic. Neck is supple. Mallampati 3,  neck circumference:  inches . Nasal airflow patent.  Retrognathia is not seen.  Dental status: 13 Cardiovascular:  Regular rate and cardiac rhythm by pulse,  without distended neck veins. Respiratory: Lungs are clear to auscultation.  Skin:  Without evidence of ankle edema, or rash. Trunk: The patient's posture is erect.   Neurologic exam : The patient is awake and alert, oriented to place and time.   Memory subjective described as intact.  Attention span & concentration ability appears normal.  Speech is fluent,  without  dysarthria, dysphonia or aphasia.  Mood and affect are chipper.    Cranial nerves: no loss of smell  or taste reported  Pupils are equal and briskly reactive to light. 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 No Diplopia. Visual fields by finger perimetry are intact. Hearing was intact to soft voice and finger rubbing.    Facial sensation intact to fine touch.  Facial motor strength is symmetric and tongue and uvula move midline.  Neck ROM : rotation, tilt and flexion extension were normal for age and shoulder shrug was symmetrical.    Motor exam:  Symmetric bulk, tone and ROM.   Normal tone without cog wheeling, symmetric grip strength .   Sensory:  Fine touch, pinprick and vibration were tested  and  normal.  Proprioception tested in the upper extremities was normal.   Coordination: Rapid alternating movements in the fingers/hands were of normal speed.  The Finger-to-nose maneuver was intact without evidence of ataxia, dysmetria or tremor.   Gait and station: Patient could rise unassisted from a seated position, walked without assistive device.  Stance is of normal width/ base and the patient turned with 3 steps.  Toe and heel walk were deferred.  Deep tendon reflexes: in the upper and lower extremities are symmetric and intact.  Babinski response was deferred.      After spending a total time of  22 minutes face to face  time for physical and neurologic examination, review of laboratory studies,  personal review of imaging studies, reports and results of other testing and review of referral information / records as far as provided in visit,  I have established the following assessments:  Michele Suarez describes an onset rather acutely of insomnia related to a domestic situation when she separated and finally divorced.  At the time she had to unbeknownst to her hiatal hernia and she broke up with heartburn felt it may be actually her heart but was not get up for this  condition.  She had 3 young children at the time had just become a single mom and there were several stressors and also anxieties related to the situation.  She remembers that through school years in college she seemed not to have had any difficulty sleeping at least she would get 7 hours of sleep easily ever since 1997 however she has not slept easily without a sleep aid but if she gains at 5-hour nocturnal sleep she is considering herself pretty happy.  She functions well during daytime she does not feel a lack of energy she does deny any kind of excessive daytime sleepiness but she is also on Adderall low-dose 10 mg daily which may mask some of the symptoms should they be there.  She had tried doxepin at night 10 mg but no benefit from it she had been built before on Ambien and over-the-counter Tylenol PM and other Benadryl like nonprescription medication.  Her medication list does not show any of the medications to be likely to produce daytime sleepiness or insomnia.  Melatonin at 10 mg has not given her relief either she has tried 2 of these tablets which I would consider a little bit too much.  She is followed also by Deirdre Pippins for GI, by Dr. Genevieve Norlander for gynecology Christen Butter for cardiology, and [facility].   1) I have wondered about the presence of a short sleeper syndrome here unless there is an underlying severe anxiety disorder that has just qualified and has caused a cycle illogical reason for this chronic insomnia my second guess is that she may be genetically predisposed short sleeper syndrome usually however manifests already by college age and that is not the case here.  What is surprising is how well she does with 5 hours of sleep or less every night.  She does not snore , has never been told she has apnea. No RLS.   Michele Suarez has tried Prozac after our initial consultation from 16 August 2020 and it has not changed her sleep pattern her ability to go to sleep or sleep longer.  She  still averages 4 to 5 hours of sleep.  Since she is no longer on an SSRI I would like to try trazodone with her and I may send an HLA test for short sleeper syndrome. Prozac was tried for 45 days and did not change her sleep pattern.      My Plan is to proceed with: 2)  Referral to cognitive BT unless genetic test is positive for short sleeper syndrome.  3) consider short -sleeper work up. [id number].  I would like to thank Leanna Battles, [address],  [place] [address] for allowing me to meet this pleasant patient. Please refer to [facility] Therapy if the genetic test is negative .     CC: I will share my notes with PCP .  Electronically signed by: Larey Seat, MD 02/20/2021 8:36 AM  [facility] Board certified by The [employer] of Sleep Medicine and Diplomate of the [employer] of Sleep Medicine. Board certified In Neurology through the [place], Fellow of the [employer]  of Neurology. Medical Director of [facility].

## 2021-02-26 DIAGNOSIS — R82998 Other abnormal findings in urine: Secondary | ICD-10-CM | POA: Diagnosis not present

## 2021-02-26 DIAGNOSIS — Z1212 Encounter for screening for malignant neoplasm of rectum: Secondary | ICD-10-CM | POA: Diagnosis not present

## 2021-03-22 DIAGNOSIS — B079 Viral wart, unspecified: Secondary | ICD-10-CM | POA: Diagnosis not present

## 2021-03-22 DIAGNOSIS — D485 Neoplasm of uncertain behavior of skin: Secondary | ICD-10-CM | POA: Diagnosis not present

## 2021-03-22 DIAGNOSIS — L570 Actinic keratosis: Secondary | ICD-10-CM | POA: Diagnosis not present

## 2021-08-13 DIAGNOSIS — Z01411 Encounter for gynecological examination (general) (routine) with abnormal findings: Secondary | ICD-10-CM | POA: Diagnosis not present

## 2021-08-13 DIAGNOSIS — Z1231 Encounter for screening mammogram for malignant neoplasm of breast: Secondary | ICD-10-CM | POA: Diagnosis not present

## 2021-08-13 DIAGNOSIS — Z124 Encounter for screening for malignant neoplasm of cervix: Secondary | ICD-10-CM | POA: Diagnosis not present

## 2021-08-13 DIAGNOSIS — Z01419 Encounter for gynecological examination (general) (routine) without abnormal findings: Secondary | ICD-10-CM | POA: Diagnosis not present

## 2021-08-21 ENCOUNTER — Ambulatory Visit: Payer: Medicare Other | Admitting: Neurology

## 2021-09-11 DIAGNOSIS — M1812 Unilateral primary osteoarthritis of first carpometacarpal joint, left hand: Secondary | ICD-10-CM | POA: Diagnosis not present

## 2021-10-01 DIAGNOSIS — Z85828 Personal history of other malignant neoplasm of skin: Secondary | ICD-10-CM | POA: Diagnosis not present

## 2021-10-01 DIAGNOSIS — D225 Melanocytic nevi of trunk: Secondary | ICD-10-CM | POA: Diagnosis not present

## 2021-10-01 DIAGNOSIS — L814 Other melanin hyperpigmentation: Secondary | ICD-10-CM | POA: Diagnosis not present

## 2021-10-01 DIAGNOSIS — L821 Other seborrheic keratosis: Secondary | ICD-10-CM | POA: Diagnosis not present

## 2022-01-02 DIAGNOSIS — H35033 Hypertensive retinopathy, bilateral: Secondary | ICD-10-CM | POA: Diagnosis not present

## 2022-01-02 DIAGNOSIS — H35013 Changes in retinal vascular appearance, bilateral: Secondary | ICD-10-CM | POA: Diagnosis not present

## 2022-01-02 DIAGNOSIS — H04123 Dry eye syndrome of bilateral lacrimal glands: Secondary | ICD-10-CM | POA: Diagnosis not present

## 2022-01-02 DIAGNOSIS — H35363 Drusen (degenerative) of macula, bilateral: Secondary | ICD-10-CM | POA: Diagnosis not present

## 2022-07-11 DIAGNOSIS — R5383 Other fatigue: Secondary | ICD-10-CM | POA: Diagnosis not present

## 2022-07-11 DIAGNOSIS — R7989 Other specified abnormal findings of blood chemistry: Secondary | ICD-10-CM | POA: Diagnosis not present

## 2022-07-11 DIAGNOSIS — E785 Hyperlipidemia, unspecified: Secondary | ICD-10-CM | POA: Diagnosis not present

## 2022-07-11 DIAGNOSIS — Z1212 Encounter for screening for malignant neoplasm of rectum: Secondary | ICD-10-CM | POA: Diagnosis not present

## 2022-07-18 DIAGNOSIS — R82998 Other abnormal findings in urine: Secondary | ICD-10-CM | POA: Diagnosis not present

## 2022-07-18 DIAGNOSIS — I2510 Atherosclerotic heart disease of native coronary artery without angina pectoris: Secondary | ICD-10-CM | POA: Diagnosis not present

## 2022-07-18 DIAGNOSIS — Z0000 Encounter for general adult medical examination without abnormal findings: Secondary | ICD-10-CM | POA: Diagnosis not present

## 2022-07-18 DIAGNOSIS — E785 Hyperlipidemia, unspecified: Secondary | ICD-10-CM | POA: Diagnosis not present

## 2022-07-18 DIAGNOSIS — F5104 Psychophysiologic insomnia: Secondary | ICD-10-CM | POA: Diagnosis not present

## 2022-08-15 DIAGNOSIS — A609 Anogenital herpesviral infection, unspecified: Secondary | ICD-10-CM | POA: Diagnosis not present

## 2022-08-15 DIAGNOSIS — Z1231 Encounter for screening mammogram for malignant neoplasm of breast: Secondary | ICD-10-CM | POA: Diagnosis not present

## 2022-08-15 DIAGNOSIS — Z01419 Encounter for gynecological examination (general) (routine) without abnormal findings: Secondary | ICD-10-CM | POA: Diagnosis not present

## 2022-08-15 DIAGNOSIS — Z124 Encounter for screening for malignant neoplasm of cervix: Secondary | ICD-10-CM | POA: Diagnosis not present

## 2022-08-26 DIAGNOSIS — Z780 Asymptomatic menopausal state: Secondary | ICD-10-CM | POA: Diagnosis not present

## 2022-08-26 DIAGNOSIS — M85851 Other specified disorders of bone density and structure, right thigh: Secondary | ICD-10-CM | POA: Diagnosis not present

## 2022-08-26 DIAGNOSIS — M85852 Other specified disorders of bone density and structure, left thigh: Secondary | ICD-10-CM | POA: Diagnosis not present

## 2022-09-12 DIAGNOSIS — M1711 Unilateral primary osteoarthritis, right knee: Secondary | ICD-10-CM | POA: Diagnosis not present

## 2022-09-25 DIAGNOSIS — L821 Other seborrheic keratosis: Secondary | ICD-10-CM | POA: Diagnosis not present

## 2022-09-25 DIAGNOSIS — Z85828 Personal history of other malignant neoplasm of skin: Secondary | ICD-10-CM | POA: Diagnosis not present

## 2022-09-25 DIAGNOSIS — D225 Melanocytic nevi of trunk: Secondary | ICD-10-CM | POA: Diagnosis not present

## 2022-09-25 DIAGNOSIS — L814 Other melanin hyperpigmentation: Secondary | ICD-10-CM | POA: Diagnosis not present

## 2022-11-20 DIAGNOSIS — K080 Exfoliation of teeth due to systemic causes: Secondary | ICD-10-CM | POA: Diagnosis not present

## 2022-11-25 DIAGNOSIS — K080 Exfoliation of teeth due to systemic causes: Secondary | ICD-10-CM | POA: Diagnosis not present

## 2022-11-27 DIAGNOSIS — L570 Actinic keratosis: Secondary | ICD-10-CM | POA: Diagnosis not present

## 2022-11-29 DIAGNOSIS — H0014 Chalazion left upper eyelid: Secondary | ICD-10-CM | POA: Diagnosis not present

## 2022-12-18 DIAGNOSIS — H0014 Chalazion left upper eyelid: Secondary | ICD-10-CM | POA: Diagnosis not present

## 2023-01-09 DIAGNOSIS — H35013 Changes in retinal vascular appearance, bilateral: Secondary | ICD-10-CM | POA: Diagnosis not present

## 2023-01-09 DIAGNOSIS — H26493 Other secondary cataract, bilateral: Secondary | ICD-10-CM | POA: Diagnosis not present

## 2023-01-09 DIAGNOSIS — H35033 Hypertensive retinopathy, bilateral: Secondary | ICD-10-CM | POA: Diagnosis not present

## 2023-01-09 DIAGNOSIS — H35363 Drusen (degenerative) of macula, bilateral: Secondary | ICD-10-CM | POA: Diagnosis not present

## 2023-03-14 DIAGNOSIS — R42 Dizziness and giddiness: Secondary | ICD-10-CM | POA: Diagnosis not present

## 2023-04-03 DIAGNOSIS — H8111 Benign paroxysmal vertigo, right ear: Secondary | ICD-10-CM | POA: Diagnosis not present

## 2023-04-03 DIAGNOSIS — H903 Sensorineural hearing loss, bilateral: Secondary | ICD-10-CM | POA: Diagnosis not present

## 2023-04-03 DIAGNOSIS — H9113 Presbycusis, bilateral: Secondary | ICD-10-CM | POA: Diagnosis not present

## 2023-04-29 DIAGNOSIS — H8111 Benign paroxysmal vertigo, right ear: Secondary | ICD-10-CM | POA: Diagnosis not present

## 2023-05-20 DIAGNOSIS — M1811 Unilateral primary osteoarthritis of first carpometacarpal joint, right hand: Secondary | ICD-10-CM | POA: Diagnosis not present

## 2023-05-20 DIAGNOSIS — M180 Bilateral primary osteoarthritis of first carpometacarpal joints: Secondary | ICD-10-CM | POA: Diagnosis not present

## 2023-05-20 DIAGNOSIS — M1812 Unilateral primary osteoarthritis of first carpometacarpal joint, left hand: Secondary | ICD-10-CM | POA: Diagnosis not present

## 2023-07-07 DIAGNOSIS — K080 Exfoliation of teeth due to systemic causes: Secondary | ICD-10-CM | POA: Diagnosis not present

## 2023-08-19 DIAGNOSIS — Z1231 Encounter for screening mammogram for malignant neoplasm of breast: Secondary | ICD-10-CM | POA: Diagnosis not present

## 2023-08-19 DIAGNOSIS — Z124 Encounter for screening for malignant neoplasm of cervix: Secondary | ICD-10-CM | POA: Diagnosis not present

## 2023-08-19 DIAGNOSIS — A609 Anogenital herpesviral infection, unspecified: Secondary | ICD-10-CM | POA: Diagnosis not present

## 2023-08-23 DIAGNOSIS — M1711 Unilateral primary osteoarthritis, right knee: Secondary | ICD-10-CM | POA: Diagnosis not present

## 2023-09-05 DIAGNOSIS — E785 Hyperlipidemia, unspecified: Secondary | ICD-10-CM | POA: Diagnosis not present

## 2023-09-05 DIAGNOSIS — M8580 Other specified disorders of bone density and structure, unspecified site: Secondary | ICD-10-CM | POA: Diagnosis not present

## 2023-09-05 DIAGNOSIS — Z1212 Encounter for screening for malignant neoplasm of rectum: Secondary | ICD-10-CM | POA: Diagnosis not present

## 2023-09-05 DIAGNOSIS — Z0189 Encounter for other specified special examinations: Secondary | ICD-10-CM | POA: Diagnosis not present

## 2023-09-15 DIAGNOSIS — D225 Melanocytic nevi of trunk: Secondary | ICD-10-CM | POA: Diagnosis not present

## 2023-09-15 DIAGNOSIS — Z85828 Personal history of other malignant neoplasm of skin: Secondary | ICD-10-CM | POA: Diagnosis not present

## 2023-09-15 DIAGNOSIS — D485 Neoplasm of uncertain behavior of skin: Secondary | ICD-10-CM | POA: Diagnosis not present

## 2023-09-15 DIAGNOSIS — D045 Carcinoma in situ of skin of trunk: Secondary | ICD-10-CM | POA: Diagnosis not present

## 2023-09-15 DIAGNOSIS — Z1212 Encounter for screening for malignant neoplasm of rectum: Secondary | ICD-10-CM | POA: Diagnosis not present

## 2023-09-15 DIAGNOSIS — L821 Other seborrheic keratosis: Secondary | ICD-10-CM | POA: Diagnosis not present

## 2023-09-15 DIAGNOSIS — L814 Other melanin hyperpigmentation: Secondary | ICD-10-CM | POA: Diagnosis not present

## 2023-09-15 DIAGNOSIS — L570 Actinic keratosis: Secondary | ICD-10-CM | POA: Diagnosis not present

## 2023-09-18 DIAGNOSIS — Z0000 Encounter for general adult medical examination without abnormal findings: Secondary | ICD-10-CM | POA: Diagnosis not present

## 2023-09-18 DIAGNOSIS — I739 Peripheral vascular disease, unspecified: Secondary | ICD-10-CM | POA: Diagnosis not present

## 2023-09-18 DIAGNOSIS — R82998 Other abnormal findings in urine: Secondary | ICD-10-CM | POA: Diagnosis not present

## 2023-11-13 DIAGNOSIS — D045 Carcinoma in situ of skin of trunk: Secondary | ICD-10-CM | POA: Diagnosis not present

## 2023-11-25 DIAGNOSIS — Z538 Procedure and treatment not carried out for other reasons: Secondary | ICD-10-CM | POA: Diagnosis not present

## 2023-11-25 DIAGNOSIS — Z1211 Encounter for screening for malignant neoplasm of colon: Secondary | ICD-10-CM | POA: Diagnosis not present

## 2023-11-26 DIAGNOSIS — D126 Benign neoplasm of colon, unspecified: Secondary | ICD-10-CM | POA: Diagnosis not present

## 2023-11-26 DIAGNOSIS — K635 Polyp of colon: Secondary | ICD-10-CM | POA: Diagnosis not present

## 2023-11-26 DIAGNOSIS — Z1211 Encounter for screening for malignant neoplasm of colon: Secondary | ICD-10-CM | POA: Diagnosis not present

## 2023-11-26 DIAGNOSIS — D122 Benign neoplasm of ascending colon: Secondary | ICD-10-CM | POA: Diagnosis not present

## 2024-01-08 DIAGNOSIS — R300 Dysuria: Secondary | ICD-10-CM | POA: Diagnosis not present

## 2024-01-08 DIAGNOSIS — N390 Urinary tract infection, site not specified: Secondary | ICD-10-CM | POA: Diagnosis not present

## 2024-01-09 DIAGNOSIS — H04123 Dry eye syndrome of bilateral lacrimal glands: Secondary | ICD-10-CM | POA: Diagnosis not present

## 2024-01-09 DIAGNOSIS — H18451 Nodular corneal degeneration, right eye: Secondary | ICD-10-CM | POA: Diagnosis not present

## 2024-01-09 DIAGNOSIS — H35033 Hypertensive retinopathy, bilateral: Secondary | ICD-10-CM | POA: Diagnosis not present

## 2024-01-09 DIAGNOSIS — H35363 Drusen (degenerative) of macula, bilateral: Secondary | ICD-10-CM | POA: Diagnosis not present

## 2024-01-09 DIAGNOSIS — H26493 Other secondary cataract, bilateral: Secondary | ICD-10-CM | POA: Diagnosis not present

## 2024-01-19 DIAGNOSIS — K080 Exfoliation of teeth due to systemic causes: Secondary | ICD-10-CM | POA: Diagnosis not present

## 2024-04-30 DIAGNOSIS — C184 Malignant neoplasm of transverse colon: Secondary | ICD-10-CM | POA: Diagnosis not present

## 2024-04-30 DIAGNOSIS — Z860100 Personal history of colon polyps, unspecified: Secondary | ICD-10-CM | POA: Diagnosis not present

## 2024-04-30 DIAGNOSIS — K5730 Diverticulosis of large intestine without perforation or abscess without bleeding: Secondary | ICD-10-CM | POA: Diagnosis not present

## 2024-04-30 DIAGNOSIS — Z79899 Other long term (current) drug therapy: Secondary | ICD-10-CM | POA: Diagnosis not present

## 2024-04-30 DIAGNOSIS — K635 Polyp of colon: Secondary | ICD-10-CM | POA: Diagnosis not present

## 2024-05-04 DIAGNOSIS — C189 Malignant neoplasm of colon, unspecified: Secondary | ICD-10-CM | POA: Diagnosis not present

## 2024-05-05 DIAGNOSIS — C184 Malignant neoplasm of transverse colon: Secondary | ICD-10-CM | POA: Diagnosis not present

## 2024-05-05 DIAGNOSIS — C189 Malignant neoplasm of colon, unspecified: Secondary | ICD-10-CM | POA: Diagnosis not present

## 2024-05-17 DIAGNOSIS — C184 Malignant neoplasm of transverse colon: Secondary | ICD-10-CM | POA: Diagnosis not present

## 2024-05-27 DIAGNOSIS — C184 Malignant neoplasm of transverse colon: Secondary | ICD-10-CM | POA: Diagnosis not present

## 2024-05-27 DIAGNOSIS — H9190 Unspecified hearing loss, unspecified ear: Secondary | ICD-10-CM | POA: Diagnosis not present

## 2024-05-27 DIAGNOSIS — K219 Gastro-esophageal reflux disease without esophagitis: Secondary | ICD-10-CM | POA: Diagnosis not present

## 2024-05-27 DIAGNOSIS — G8918 Other acute postprocedural pain: Secondary | ICD-10-CM | POA: Diagnosis not present

## 2024-06-04 DIAGNOSIS — R300 Dysuria: Secondary | ICD-10-CM | POA: Diagnosis not present

## 2024-06-21 DIAGNOSIS — C184 Malignant neoplasm of transverse colon: Secondary | ICD-10-CM | POA: Diagnosis not present

## 2024-07-06 DIAGNOSIS — Z23 Encounter for immunization: Secondary | ICD-10-CM | POA: Diagnosis not present

## 2024-07-06 DIAGNOSIS — K1120 Sialoadenitis, unspecified: Secondary | ICD-10-CM | POA: Diagnosis not present

## 2024-08-19 DIAGNOSIS — K080 Exfoliation of teeth due to systemic causes: Secondary | ICD-10-CM | POA: Diagnosis not present

## 2024-09-14 DIAGNOSIS — S0300XA Dislocation of jaw, unspecified side, initial encounter: Secondary | ICD-10-CM

## 2024-09-16 ENCOUNTER — Ambulatory Visit
Admission: RE | Admit: 2024-09-16 | Discharge: 2024-09-16 | Disposition: A | Discharge status: Home / Self Care | Source: Ambulatory Visit | Attending: Dentistry | Admitting: Dentistry

## 2024-09-16 DIAGNOSIS — S0300XA Dislocation of jaw, unspecified side, initial encounter: Secondary | ICD-10-CM
